# Patient Record
Sex: Male | Born: 1965
Health system: Southern US, Community
[De-identification: ages and names within clinical notes are randomized; demographics above are authoritative.]

## PROBLEM LIST (undated history)

## (undated) DIAGNOSIS — R0789 Other chest pain: Secondary | ICD-10-CM

## (undated) DIAGNOSIS — F32A Depression, unspecified: Secondary | ICD-10-CM

## (undated) DIAGNOSIS — E781 Pure hyperglyceridemia: Secondary | ICD-10-CM

## (undated) DIAGNOSIS — I10 Essential (primary) hypertension: Secondary | ICD-10-CM

## (undated) DIAGNOSIS — Z9889 Other specified postprocedural states: Secondary | ICD-10-CM

## (undated) DIAGNOSIS — F329 Major depressive disorder, single episode, unspecified: Secondary | ICD-10-CM

## (undated) DIAGNOSIS — K219 Gastro-esophageal reflux disease without esophagitis: Secondary | ICD-10-CM

## (undated) DIAGNOSIS — I2510 Atherosclerotic heart disease of native coronary artery without angina pectoris: Secondary | ICD-10-CM

## (undated) HISTORY — PX: CARDIAC CATHETERIZATION: SHX172

## (undated) HISTORY — DX: Pure hyperglyceridemia: E78.1

## (undated) HISTORY — DX: Atherosclerotic heart disease of native coronary artery without angina pectoris: I25.10

## (undated) HISTORY — DX: Gastro-esophageal reflux disease without esophagitis: K21.9

## (undated) HISTORY — DX: Other chest pain: R07.89

## (undated) HISTORY — DX: Essential (primary) hypertension: I10

## (undated) HISTORY — DX: Other specified postprocedural states: Z98.890

---

## 2000-11-01 ENCOUNTER — Emergency Department (HOSPITAL_COMMUNITY): Admission: EM | Admit: 2000-11-01 | Discharge: 2000-11-01 | Payer: Self-pay | Admitting: Emergency Medicine

## 2000-11-01 ENCOUNTER — Encounter: Payer: Self-pay | Admitting: Emergency Medicine

## 2003-02-15 ENCOUNTER — Emergency Department (HOSPITAL_COMMUNITY): Admission: EM | Admit: 2003-02-15 | Discharge: 2003-02-15 | Payer: Self-pay | Admitting: Emergency Medicine

## 2003-02-15 ENCOUNTER — Encounter: Payer: Self-pay | Admitting: Emergency Medicine

## 2003-06-12 ENCOUNTER — Encounter: Admission: RE | Admit: 2003-06-12 | Discharge: 2003-06-12 | Payer: Self-pay | Admitting: Occupational Medicine

## 2003-06-12 ENCOUNTER — Encounter: Payer: Self-pay | Admitting: Occupational Medicine

## 2007-06-13 ENCOUNTER — Emergency Department (HOSPITAL_COMMUNITY): Admission: EM | Admit: 2007-06-13 | Discharge: 2007-06-14 | Payer: Self-pay | Admitting: Emergency Medicine

## 2010-03-02 ENCOUNTER — Ambulatory Visit: Payer: Self-pay | Admitting: Cardiology

## 2010-03-02 ENCOUNTER — Encounter: Payer: Self-pay | Admitting: Cardiology

## 2010-03-02 ENCOUNTER — Inpatient Hospital Stay (HOSPITAL_COMMUNITY): Admission: EM | Admit: 2010-03-02 | Discharge: 2010-03-03 | Payer: Self-pay | Admitting: Cardiology

## 2010-03-03 ENCOUNTER — Encounter: Payer: Self-pay | Admitting: Cardiology

## 2010-03-09 ENCOUNTER — Encounter: Payer: Self-pay | Admitting: Cardiology

## 2010-03-09 DIAGNOSIS — E782 Mixed hyperlipidemia: Secondary | ICD-10-CM | POA: Insufficient documentation

## 2010-03-09 DIAGNOSIS — K219 Gastro-esophageal reflux disease without esophagitis: Secondary | ICD-10-CM | POA: Insufficient documentation

## 2010-03-09 DIAGNOSIS — E785 Hyperlipidemia, unspecified: Secondary | ICD-10-CM

## 2010-03-09 DIAGNOSIS — R079 Chest pain, unspecified: Secondary | ICD-10-CM | POA: Insufficient documentation

## 2010-03-11 ENCOUNTER — Ambulatory Visit: Payer: Self-pay | Admitting: Cardiology

## 2010-04-29 ENCOUNTER — Encounter: Payer: Self-pay | Admitting: Cardiology

## 2010-10-20 NOTE — Miscellaneous (Signed)
  Clinical Lists Changes  Problems: Added new problem of GERD (ICD-530.81) Added new problem of CHEST PAIN (ICD-786.50) Added new problem of DYSLIPIDEMIA (ICD-272.4) Observations: Added new observation of PAST MED HX: Chest tight  troponin elevation 0.4.Marland KitchenMarland KitchenMarland Kitchencath  [facility]....03/03/2010...normal Hypertriglyceridemia GERD EF   60%...cath....02/2010 (03/09/2010 18:02)       Past History:  Past Medical History: Chest tight  troponin elevation 0.4.Marland KitchenMarland KitchenMarland Kitchencath  [facility]....03/03/2010...normal Hypertriglyceridemia GERD EF   60%...cath....02/2010

## 2010-10-20 NOTE — Assessment & Plan Note (Signed)
Summary: eph post cath per Trish needs to have wound check   Visit Type:  hospital follow-up Primary Provider:  Lynden Oxford  CC:  chest pain.  History of Present Illness: The patient was seen at [facility] emergency room for chest discomfort.  He has a history of GERD.  He knows when his GERD symptoms are becoming more marked.  This was not the case on the day of the emergency room visit.  He awoke on that morning with some localized left chest pain.  There was some radiation to his arm.  He felt a little clammy.  He came to emergency room.  The pain was essentially gone when he arrived.  There was no diagnostic EKG changes.  His troponin was 0.4., 4.9, 4.8.  There were no diagnostic EKG changes.  Cardiac catheterization was done.  The coronaries were normal.  There were no significant wall motion abnormalities.  Preventive Screening-Counseling & Management  Alcohol-Tobacco     Smoking Status: never  Current Medications (verified): 1)  Aspirin 325 Mg Tabs (Aspirin) .... Take 1 Tablet By Mouth Once A Day 2)  Metoprolol Tartrate 25 Mg Tabs (Metoprolol Tartrate) .... Take 1/2 Tablet By Mouth Two Times A Day 3)  Crestor 40 Mg Tabs (Rosuvastatin Calcium) .... Take 1 Tablet By Mouth Once A Day 4)  Prilosec Otc 20 Mg Tbec (Omeprazole Magnesium) .... Take 1 Tablet By Mouth Once A Day 5)  Nitrostat 0.4 Mg Subl (Nitroglycerin) .... Use As Directed  Allergies (verified): 1)  ! Sulfa  Comments:  Nurse/Medical Assistant: The patient's medication bottles and allergies were reviewed with the patient and were updated in the Medication and Allergy Lists.  Past History:  Past Medical History: Chest tight  troponin elevation 0.4.Marland KitchenMarland KitchenMarland Kitchencath  [facility]....03/03/2010...normal Hypertriglyceridemia GERD EF   60%...cath....02/2010  Social History: Smoking Status:  never  Review of Systems       Patient denies fever, chills, headache, sweats, rash, change in vision, change in hearing, chest pain,  cough, nausea vomiting, urinary symptoms.  All other systems are reviewed and are negative.  Vital Signs:  Patient profile:   45 year old male Height:      75 inches Weight:      206 pounds BMI:     25.84 Pulse rate:   57 / minute BP sitting:   111 / 73  (left arm) Cuff size:   large  Vitals Entered By: Carlye Grippe (March 11, 2010 3:05 PM)  Nutrition Counseling: Patient's BMI is greater than 25 and therefore counseled on weight management options.  Physical Exam  General:  patient is stable. Head:  head is atraumatic. Eyes:  no xanthelasma. Neck:  no jugular venous distention. Chest Wall:  no chest wall tenderness. Lungs:  lungs are clear.  respiratory effort is nonlabored. Heart:  Cardiac exam reveals S1-S2.  No clicks or significant murmurs Abdomen:  abdomen is soft. Msk:  no musculoskeletal deformities. Extremities:  no peripheral edema. Skin:  no skin rashes. Psych:  patient is oriented to person time and place.  Affect is normal.   Impression & Recommendations:  Problem # 1:  DYSLIPIDEMIA (ICD-272.4)  His updated medication list for this problem includes:    Crestor 40 Mg Tabs (Rosuvastatin calcium) .Marland Kitchen... Take 1 tablet by mouth once a day The patient has dyslipidemia.  We will arrange for his followup fasting lipid profile in 6 weeks.  We may be able to decrease his Crestor dose over time.  Future Orders: T-Hepatic Function [phone]) .Marland KitchenMarland Kitchen  04/20/2010 T-Lipid Profile [phone]) ... 04/20/2010  Problem # 2:  GERD (ICD-530.81)  His updated medication list for this problem includes:    Prilosec Otc 20 Mg Tbec (Omeprazole magnesium) .Marland Kitchen... Take 1 tablet by mouth once a day The patient has GERD.  His most recent symptoms do not seem compatible with his GERD.  Problem # 3:  CHEST PAIN (ICD-786.50)  The following medications were removed from the medication list:    Metoprolol Tartrate 25 Mg Tabs (Metoprolol tartrate) .Marland Kitchen... Take 1/2 tablet by mouth two times a  day His updated medication list for this problem includes:    Aspirin 325 Mg Tabs (Aspirin) .Marland Kitchen... Take 1 tablet by mouth once a day    Nitrostat 0.4 Mg Subl (Nitroglycerin) ..... Use as directed The patient had an episode of chest discomfort with radiation to the arm.  He did have troponin elevation.  Coronary arteries were normal.  Wall motion was normal.  He did not have a myocardial infarction in the true sense of the word related to epicardial disease.  He may have had spasm or any acute microvascular event.  Over time he needs to be treated carefully with aspirin, weight control, blood pressure control, glucose control, lipid control, and exercise.  He is to go back to full activities.  Because he has no proven epicardial disease I have chosen to stop his beta blocker today.  As part of the evaluation today I reviewed the records from [facility] and the discharge summary from [facility] in the catheter report.  I have spoken to the patient at great length along with his wife.  EKG is done today and reviewed by me.  It is normal with mild sinus bradycardia.  I will see him back in 3 months for followup.  Orders: EKG w/ Interpretation ([id number])  Patient Instructions: 1)  Stop Metoprolol. 2)  Your physician recommends that you go to the [facility] for a FASTING lipid profile and liver function labs: DO IN 6 WEEKS. Do not eat or drink after midnight.  3)  Your physician wants you to follow-up in: 3 months. You will receive a reminder letter in the mail one-two months in advance. If you don't receive a letter, please call our office to schedule the follow-up appointment.

## 2010-10-20 NOTE — Letter (Signed)
Summary: [facility] D/C DR. Wende Crease  [facility] D[facility]C DR. Wende Crease   Imported By: Zachary George 03/11/2010 13:59:27  _____________________________________________________________________  External Attachment:    Type:   Image     Comment:   External Document

## 2010-10-20 NOTE — Letter (Signed)
Summary: [facility]   [facility]   Imported By: Roderic Ovens 03/27/2010 15:34:11  _____________________________________________________________________  External Attachment:    Type:   Image     Comment:   External Document

## 2010-12-07 LAB — LIPID PANEL
Cholesterol: 140 mg/dL (ref 0–200)
LDL Cholesterol: 70 mg/dL (ref 0–99)

## 2010-12-07 LAB — CARDIAC PANEL(CRET KIN+CKTOT+MB+TROPI)
CK, MB: 25.1 ng/mL (ref 0.3–4.0)
Relative Index: 6 — ABNORMAL HIGH (ref 0.0–2.5)
Total CK: 425 U/L — ABNORMAL HIGH (ref 7–232)
Troponin I: 4.84 ng/mL (ref 0.00–0.06)
Troponin I: 4.94 ng/mL (ref 0.00–0.06)

## 2010-12-07 LAB — BASIC METABOLIC PANEL
CO2: 25 mEq/L (ref 19–32)
Calcium: 9 mg/dL (ref 8.4–10.5)
Creatinine, Ser: 1.08 mg/dL (ref 0.4–1.5)
GFR calc Af Amer: >60 mL/min (ref >60)
GFR calc non Af Amer: >60 mL/min (ref >60)
Sodium: 139 mEq/L (ref 135–145)

## 2010-12-07 LAB — CBC
MCHC: 34.3 g/dL (ref 30.0–36.0)
MCV: 88.6 fL (ref 78.0–100.0)
Platelets: 209 10*3/uL (ref 150–400)
RBC: 4.88 MIL/uL (ref 4.22–5.81)
RDW: 13.2 % (ref 11.5–15.5)

## 2010-12-07 LAB — PROTIME-INR: INR: 1.03 (ref 0.00–1.49)

## 2010-12-07 LAB — TSH: TSH: 1.391 u[IU]/mL (ref 0.350–4.500)

## 2010-12-07 LAB — MRSA PCR SCREENING: MRSA by PCR: NEGATIVE

## 2010-12-07 LAB — HEPARIN LEVEL (UNFRACTIONATED): Heparin Unfractionated: <0.1 IU/mL — ABNORMAL LOW (ref 0.30–0.70)

## 2011-03-29 ENCOUNTER — Encounter: Payer: Self-pay | Admitting: Cardiology

## 2011-07-01 LAB — POCT I-STAT CREATININE: Operator id: 277751

## 2011-07-01 LAB — POCT CARDIAC MARKERS
CKMB, poc: 2.1
Operator id: 277751
Troponin i, poc: <0.05

## 2011-07-01 LAB — I-STAT 8, (EC8 V) (CONVERTED LAB)
Acid-Base Excess: 2
Glucose, Bld: 96
Potassium: 3.7
TCO2: 30
pCO2, Ven: 47.1
pH, Ven: 7.387 — ABNORMAL HIGH

## 2012-01-13 ENCOUNTER — Emergency Department (HOSPITAL_COMMUNITY)
Admission: EM | Admit: 2012-01-13 | Discharge: 2012-01-13 | Disposition: A | Discharge status: Home / Self Care | Payer: Self-pay | Source: Home / Self Care | Attending: Emergency Medicine | Admitting: Emergency Medicine

## 2012-01-13 ENCOUNTER — Encounter (HOSPITAL_COMMUNITY): Payer: Self-pay | Admitting: Emergency Medicine

## 2012-01-13 DIAGNOSIS — N410 Acute prostatitis: Secondary | ICD-10-CM

## 2012-01-13 LAB — POCT URINALYSIS DIP (DEVICE)
Glucose, UA: NEGATIVE mg/dL
Nitrite: POSITIVE — AB
pH: 5.5 (ref 5.0–8.0)

## 2012-01-13 MED ORDER — LIDOCAINE HCL (PF) 1 % IJ SOLN
INTRAMUSCULAR | Status: AC
  Filled 2012-01-13: qty 5

## 2012-01-13 MED ORDER — CEFTRIAXONE SODIUM 1 G IJ SOLR
1.0000 g | Freq: Once | INTRAMUSCULAR | Status: AC
  Administered 2012-01-13: 1 g via INTRAMUSCULAR

## 2012-01-13 MED ORDER — CEFTRIAXONE SODIUM 1 G IJ SOLR
INTRAMUSCULAR | Status: AC
  Filled 2012-01-13: qty 10

## 2012-01-13 MED ORDER — CIPROFLOXACIN HCL 500 MG PO TABS: 500.0000 mg | ORAL_TABLET | Freq: Two times a day (BID) | ORAL | Status: AC

## 2012-01-13 NOTE — ED Notes (Signed)
Starting yesterday pt has had painful urination and frequency. Pt also complaining of severe lower back pain, headaches, and fever. Pt states ibuprofen has helped but only a little bit.

## 2012-01-13 NOTE — Discharge Instructions (Signed)
Prostatitis Prostatitis is an inflammation (the body's way of reacting to injury and/or infection) of the prostate gland. The prostate gland is a male organ. The gland is about the size and shape of a walnut. The prostate is located just below the bladder. It produces semen, which is a fluid that helps nourish and transport sperm. Prostatitis is the most common urinary tract problem in men younger than age 44. There are 4 categories of prostatitis:  I - Acute bacterial prostatitis.   II - Chronic bacterial prostatitis.   III - Chronic prostatitis and chronic pelvic pain syndrome (CPPS).   Inflammatory.   Non inflammatory.   IV - Asymptomatic inflammatory prostatitis.  Acute and chronic bacterial prostatitis are problems with bacterial infections of the prostate. "Acute" infection is usually a one-time problem. "Chronic" bacterial prostatitis is a condition with recurrent infection. It is usually caused by the same germ(bacteria). CPPS has symptoms similar to prostate infection. However, no infection is actually found. This condition can cause problems of ongoing pain. Currently, it cannot be cured. Treatments are available and aimed at symptom control.  Asymptomatic inflammatory prostatitis has no symptoms. It is a condition where infection-fighting cells are found by chance in the urine. The diagnosis is made most often during an exam for other conditions. Other conditions could be infertility or a high level of PSA (prostate-specific antigen) in the blood. SYMPTOMS  Symptoms can vary depending upon the type of prostatitis that exists. There can also be overlap in symptoms. This can make diagnosis difficult. Symptoms: For Acute bacterial prostatitis  Painful urination.   Fever or chills.   Muscle or joint pains.   Low back pain.   Low abdominal pain.   Inability to empty bladder completely.   Sudden urges to urinate.   Frequent urination during the day.   Difficulty starting  urine stream.   Need to urinate several times at night (nocturia).   Weak urine stream.   Urethral (tube that carries urine from the bladder out of the body) discharge and dribbling after urination.  For Chronic bacterial prostatitis  Rectal pain.   Pain in the testicles, penis, or tip of the penis.   Pain in the space between the anus and scrotum (perineum).   Low back pain.   Low abdominal pain.   Problems with sexual function.   Painful ejaculation.   Bloody semen.   Inability to empty bladder completely.   Painful urination.   Sudden urges to urinate.   Frequent urination during the day.   Difficulty starting urine stream.   Need to urinate several times at night (nocturia).   Weak urine stream.   Dribbling after urination.   Urethral discharge.  For Chronic prostatitis and chronic pelvic pain syndrome (CPPS) Symptoms are the same as those for chronic bacterial prostatitis. Problems with sexual function are often the reason for seeking care. This important problem should be discussed with your caregiver. For Asymptomatic inflammatory prostatitis As noted above, there are no symptoms with this condition. DIAGNOSIS   Your caregiver may perform a rectal exam. This exam is to determine if the prostate is swollen and tender.   Sometimes blood work is performed. This is done to see if your white blood cell count is elevated. The Prostate Specific Antigen (PSA) is also measured. PSA is a blood test that can help detect early prostate cancer.   A urinalysis is done to find out what type of infection is present if this is a suspected cause. An   additional urinalysis may be done after a digital rectal exam. This is to see if white blood cells are pushed out of the prostate and into the urine. A low-grade infection of the prostate may not be found on the first urinalysis.  In more difficult cases, your caregiver may advise other tests. Tests could include:  Urodynamics  -- Tests the function of the bladder and the organs involved in triggering and controlling normal urination.   Urine flow rate.   Cystoscopy -- In this procedure, a thin, telescope-like tube with a light and tiny camera attached (cystoscope) is inserted into the bladder through the urethra. This allows the caregiver to see the inside of the urethra and bladder.   Electromyography -- This procedure tests how the muscles and nerves of the bladder work. It is focused on the muscles that control the anus and pelvic floor. These are the muscles between the anus and scrotum.  In people who show no signs of infection, certain uncommon infections might be causing constant or recurrent symptoms. These uncommon infections are difficult to detect. More work in medicine may help find solutions to these problems. TREATMENT  Antibiotics are used to treat infections caused by germs. If the infection is not treated and becomes long lasting (chronic), it may become a lower grade infection with minor, continual problems. Without treatment, the prostate may develop a boil or furuncle (abscess). This may require surgical treatment. For those with chronic prostatitis and CPPS, it is important to work closely with your primary caregiver and urologist. For some, the medicines that are used to treat a non-cancerous, enlarged prostate (benign prostatic hypertrophy) may be helpful. Referrals to specialists other than urologists may be necessary. In rare cases when all treatments have been inadequate for pain control, an operation to remove the prostate may be recommended. This is very rare and before this is considered thorough discussion with your urologist is highly recommended.  In cases of secondary to chronic non-bacterial prostatitis, a good relationship with your urologist or primary caregiver is essential because it is often a recurrent prolonged condition that requires a good understanding of the causes and a commitment  to therapy aimed at controlling your symptoms. HOME CARE INSTRUCTIONS   Hot sitz baths for 20 minutes, 4 times per day, may help relieve pain.   Non-prescription pain killers may be used as your caregiver recommends if you have no allergies to them. Some illnesses or conditions prevent use of non-prescription drugs. If unsure, check with your caregiver. Take all medications as directed. Take the antibiotics for the prescribed length of time, even if you are feeling better.  SEEK MEDICAL CARE IF:   You have any worsening of the symptoms that originally brought you to your caregiver.   You have an oral temperature above 102 F (38.9 C).   You experience any side effects from medications prescribed.  SEEK IMMEDIATE MEDICAL CARE IF:   You have an oral temperature above 102 F (38.9 C), not controlled by medicine.   You have pain not relieved with medications.   You develop nausea, vomiting, lightheadedness, or have a fainting episode.   You are unable to urinate.   You pass bloody urine or clots.  Document Released: 09/03/2000 Document Revised: 08/26/2011 Document Reviewed: 08/09/2011 [employer] Patient Information 2012 [employer]. 

## 2012-01-13 NOTE — ED Provider Notes (Signed)
Chief Complaint  Patient presents with  . Dysuria    History of Present Illness:   Jon Shaw is a 46 year old male who has had a two-day history of headache, fever, sweats, difficulty urinating, decrease in his urine stream, dysuria, frequency, orange discoloration to his urine, low back pain, and nausea. He denies any vomiting. He had a history of urinary tract infection 10 years ago. No prior history of prostate problems. He denies any urethral discharge or testicular pain or swelling.  Review of Systems:  Other than noted above, the patient denies any of the following symptoms: General:  No fevers, chills, sweats, aches, or fatigue. GI:  No abdominal pain, back pain, nausea, vomiting, diarrhea, or constipation. GU:  No dysuria, frequency, urgency, hematuria, urethral discharge, penile lesions, penile pain, testicular pain, swelling, or mass, inguinal lymphadenopathy or incontinence.  PMFSH:  Past medical history, family history, social history, meds, and allergies were reviewed.  Physical Exam:   Vital signs:  BP 106/66  Pulse 98  Temp(Src) 100.6 F (38.1 C) (Oral)  Resp 14  SpO2 96% Gen:  Alert, oriented, in no distress. Lungs:  Clear to auscultation, no wheezes, rales or rhonchi. Heart:  Regular rhythm, no gallop or murmer. Abdomen:  Flat and soft.  No tenderness to palpation, guarding, or rebound.  No hepato-splenomegaly or mass.  Bowel sounds were normally active.  No hernia. Genital exam:  There is no nasal discharge. No penile lesions. No testicular pain or swelling. Rectal exam:  Digital rectal exam reveals prostate to be enlarged, boggy, and tender to palpation. There were no nodules or masses. Stool was heme-negative. Back:  No CVA tenderness.  Skin:  Clear, warm and dry.  Labs:   Results for orders placed during the hospital encounter of 01/13/12  POCT URINALYSIS DIP (DEVICE)      Component Value Range   Glucose, UA NEGATIVE  NEGATIVE (mg/dL)   Bilirubin Urine SMALL (*)  NEGATIVE    Ketones, ur TRACE (*) NEGATIVE (mg/dL)   Specific Gravity, Urine 1.020  1.005 - 1.030    Hgb urine dipstick LARGE (*) NEGATIVE    pH 5.5  5.0 - 8.0    Protein, ur 100 (*) NEGATIVE (mg/dL)   Urobilinogen, UA 2.0 (*) 0.0 - 1.0 (mg/dL)   Nitrite POSITIVE (*) NEGATIVE    Leukocytes, UA SMALL (*) NEGATIVE     Other Labs Obtained at Urgent Care Center:  Urine was cultured and.  Results are pending at this time and we will call about any positive results.  Course in Urgent Care Center:   He was given Rocephin 1000 mg IM and tolerated this well without any immediate side effects.  Assessment: The encounter diagnosis was Acute prostatitis.   Plan:   1.  The following meds were prescribed:   New Prescriptions   CIPROFLOXACIN (CIPRO) 500 MG TABLET    Take 1 tablet (500 mg total) by mouth every 12 (twelve) hours.   2.  The patient was instructed in symptomatic care and handouts were given. 3.  The patient was told to return if becoming worse in any way, if no better in 3 or 4 days, and given some red flag symptoms that would indicate earlier return. Additionally, he was told to followup with his primary care physician in 2 weeks for a recheck on his prostate and a repeat urinalysis and culture.     Reuben Likes, MD 01/13/12 [phone]

## 2012-01-16 LAB — URINE CULTURE
Colony Count: >=100000
Culture  Setup Time: 201304252043

## 2012-01-17 NOTE — ED Notes (Signed)
Urine culture positive for ESCHERICHIA COLI, adequately treated with Cipro at visit.

## 2016-05-12 ENCOUNTER — Encounter (HOSPITAL_COMMUNITY): Payer: Self-pay | Admitting: *Deleted

## 2016-05-12 ENCOUNTER — Inpatient Hospital Stay (HOSPITAL_COMMUNITY)
Admission: AD | Admit: 2016-05-12 | Discharge: 2016-05-18 | DRG: 885 | Disposition: A | Discharge status: Home / Self Care | Payer: BLUE CROSS/BLUE SHIELD | Attending: Psychiatry | Admitting: Psychiatry

## 2016-05-12 ENCOUNTER — Emergency Department (HOSPITAL_COMMUNITY)
Admission: EM | Admit: 2016-05-12 | Discharge: 2016-05-12 | Disposition: A | Discharge status: Other Acute Inpatient Hospital | Payer: BLUE CROSS/BLUE SHIELD | Attending: Emergency Medicine | Admitting: Emergency Medicine

## 2016-05-12 ENCOUNTER — Encounter (HOSPITAL_COMMUNITY): Payer: Self-pay | Admitting: Emergency Medicine

## 2016-05-12 DIAGNOSIS — E781 Pure hyperglyceridemia: Secondary | ICD-10-CM | POA: Diagnosis present

## 2016-05-12 DIAGNOSIS — E785 Hyperlipidemia, unspecified: Secondary | ICD-10-CM | POA: Diagnosis present

## 2016-05-12 DIAGNOSIS — G4700 Insomnia, unspecified: Secondary | ICD-10-CM | POA: Diagnosis present

## 2016-05-12 DIAGNOSIS — R45851 Suicidal ideations: Secondary | ICD-10-CM | POA: Diagnosis present

## 2016-05-12 DIAGNOSIS — F322 Major depressive disorder, single episode, severe without psychotic features: Secondary | ICD-10-CM | POA: Diagnosis present

## 2016-05-12 DIAGNOSIS — Z818 Family history of other mental and behavioral disorders: Secondary | ICD-10-CM

## 2016-05-12 DIAGNOSIS — F4321 Adjustment disorder with depressed mood: Secondary | ICD-10-CM | POA: Diagnosis present

## 2016-05-12 DIAGNOSIS — Z79899 Other long term (current) drug therapy: Secondary | ICD-10-CM | POA: Diagnosis not present

## 2016-05-12 DIAGNOSIS — Z008 Encounter for other general examination: Secondary | ICD-10-CM | POA: Diagnosis present

## 2016-05-12 DIAGNOSIS — Z7982 Long term (current) use of aspirin: Secondary | ICD-10-CM | POA: Insufficient documentation

## 2016-05-12 DIAGNOSIS — K219 Gastro-esophageal reflux disease without esophagitis: Secondary | ICD-10-CM | POA: Diagnosis present

## 2016-05-12 DIAGNOSIS — F411 Generalized anxiety disorder: Secondary | ICD-10-CM | POA: Diagnosis present

## 2016-05-12 DIAGNOSIS — F332 Major depressive disorder, recurrent severe without psychotic features: Secondary | ICD-10-CM | POA: Diagnosis not present

## 2016-05-12 HISTORY — DX: Major depressive disorder, single episode, unspecified: F32.9

## 2016-05-12 HISTORY — DX: Depression, unspecified: F32.A

## 2016-05-12 LAB — COMPREHENSIVE METABOLIC PANEL
ALBUMIN: 4.8 g/dL (ref 3.5–5.0)
ALK PHOS: 52 U/L (ref 38–126)
ALT: 24 U/L (ref 17–63)
ANION GAP: 9 (ref 5–15)
AST: 20 U/L (ref 15–41)
BILIRUBIN TOTAL: 0.8 mg/dL (ref 0.3–1.2)
BUN: 16 mg/dL (ref 6–20)
CALCIUM: 9.3 mg/dL (ref 8.9–10.3)
CO2: 23 mmol/L (ref 22–32)
CREATININE: 1.03 mg/dL (ref 0.61–1.24)
Chloride: 106 mmol/L (ref 101–111)
Glucose, Bld: 105 mg/dL — ABNORMAL HIGH (ref 65–99)
POTASSIUM: 3.7 mmol/L (ref 3.5–5.1)
Sodium: 138 mmol/L (ref 135–145)
TOTAL PROTEIN: 8.1 g/dL (ref 6.5–8.1)

## 2016-05-12 LAB — CBC
HCT: 45.3 % (ref 39.0–52.0)
Hemoglobin: 15.7 g/dL (ref 13.0–17.0)
MCH: 30.3 pg (ref 26.0–34.0)
MCHC: 34.7 g/dL (ref 30.0–36.0)
MCV: 87.3 fL (ref 78.0–100.0)
PLATELETS: 248 10*3/uL (ref 150–400)
RBC: 5.19 MIL/uL (ref 4.22–5.81)
RDW: 12.6 % (ref 11.5–15.5)
WBC: 8.4 10*3/uL (ref 4.0–10.5)

## 2016-05-12 LAB — ACETAMINOPHEN LEVEL: Acetaminophen (Tylenol), Serum: <10 ug/mL — ABNORMAL LOW (ref 10–30)

## 2016-05-12 LAB — RAPID URINE DRUG SCREEN, HOSP PERFORMED
Amphetamines: NOT DETECTED
Barbiturates: NOT DETECTED
Benzodiazepines: NOT DETECTED
Cocaine: NOT DETECTED
OPIATES: NOT DETECTED
TETRAHYDROCANNABINOL: NOT DETECTED

## 2016-05-12 LAB — SALICYLATE LEVEL: Salicylate Lvl: <4 mg/dL (ref 2.8–30.0)

## 2016-05-12 LAB — ETHANOL

## 2016-05-12 NOTE — ED Notes (Signed)
[facility] called to infor of departure
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
## 2016-05-12 NOTE — ED Notes (Signed)
Security notified to wand patient.  

## 2016-05-12 NOTE — ED Notes (Signed)
Report to ZurichBrittney, Charity fundraiserN, adult unit [facility]

## 2016-05-12 NOTE — ED Triage Notes (Signed)
Pt brought in by [facility] under IVC. Pt IVC by his wife due to making remarks about wanting to harm himself to his daughter. Pt denies any plan or thoughts of SI/HI. Pt hesitant to answer questions. Per deputy, IVC papers are en route by another deputy.

## 2016-05-12 NOTE — ED Provider Notes (Signed)
[facility] Provider Note   CSN: [id number] Arrival date & time: 05/12/16  1238  By signing my name below, I, Jon Shaw, attest that this documentation has been prepared under the direction and in the presence of Jon BerkshireJoseph Nakkia Mackiewicz, MD . Electronically Signed: Freida Busmaniana Shaw, Scribe. 05/12/2016. 1:05 PM.   History   Chief Complaint Chief Complaint  Patient presents with  . V70.1    The patient told a family member that he wanted to kill himself. IVC papers were taken out   The history is provided by the patient. No language interpreter was used.  Altered Mental Status   This is a new problem. The current episode started yesterday. The problem has been resolved. Pertinent negatives include no seizures, no hallucinations, no self-injury and no violence. Risk factors: Unknown. His past medical history is significant for depression.   HPI Comments:  Jon Shaw is a 50 y.o. male with a history of depression who presents to the Emergency Department under IVC. Pt made comments to his daughter yesterday about wanting to harm himself. He denies SI today. He also denies h/o self-injury. Pt has no acute physical complaints or symptoms at this time.    Past Medical History:  Diagnosis Date  . Chest tightness    [facility]. [facility] - normal  . Depression   . GERD (gastroesophageal reflux disease)   . History of cardiac catheterization    EF 60% 6/11  . Hypertriglyceridemia     Patient Active Problem List   Diagnosis Date Noted  . DYSLIPIDEMIA 03/09/2010  . GERD 03/09/2010  . CHEST PAIN 03/09/2010    Past Surgical History:  Procedure Laterality Date  . CARDIAC CATHETERIZATION         Home Medications    Prior to Admission medications   Medication Sig Start Date End Date Taking? Authorizing Provider  aspirin 325 MG tablet Take 325 mg by mouth daily.      Historical Provider, MD  nitroGLYCERIN (NITROSTAT) 0.4 MG SL tablet Place 0.4 mg under the  tongue every 5 (five) minutes as needed.      Historical Provider, MD  omeprazole (PRILOSEC) 20 MG capsule Take 20 mg by mouth daily.      Historical Provider, MD  rosuvastatin (CRESTOR) 10 MG tablet Take 10 mg by mouth daily.      Historical Provider, MD    Family History No family history on file.  Social History Social History  Substance Use Topics  . Smoking status: Never Smoker  . Smokeless tobacco: Never Used  . Alcohol use No     Allergies   Sulfonamide derivatives   Review of Systems Review of Systems  Constitutional: Negative for appetite change and fatigue.  HENT: Negative for congestion, ear discharge and sinus pressure.   Eyes: Negative for discharge.  Respiratory: Negative for cough.   Cardiovascular: Negative for chest pain.  Gastrointestinal: Negative for abdominal pain and diarrhea.  Genitourinary: Negative for frequency and hematuria.  Musculoskeletal: Negative for back pain.  Skin: Negative for rash.  Neurological: Negative for seizures and headaches.  Psychiatric/Behavioral: Positive for suicidal ideas. Negative for hallucinations and self-injury.     Physical Exam Updated Vital Signs BP 148/96 (BP Location: Left Arm)   Pulse 78   Temp 98.9 F (37.2 C) (Oral)   Resp 16   Ht 6\' 3"  (1.905 m)   Wt 195 lb (88.5 kg)   SpO2 98%   BMI 24.37 kg/m   Physical Exam  Constitutional:  He is oriented to person, place, and time. He appears well-developed.  HENT:  Head: Normocephalic.  Eyes: Conjunctivae and EOM are normal. No scleral icterus.  Neck: Neck supple. No thyromegaly present.  Cardiovascular: Normal rate and regular rhythm.  Exam reveals no gallop and no friction rub.   No murmur heard. Pulmonary/Chest: No stridor. He has no wheezes. He has no rales. He exhibits no tenderness.  Abdominal: He exhibits no distension. There is no tenderness. There is no rebound.  Musculoskeletal: Normal range of motion. He exhibits no edema.  Lymphadenopathy:     He has no cervical adenopathy.  Neurological: He is oriented to person, place, and time. He exhibits normal muscle tone. Coordination normal.  Skin: No rash noted. No erythema.  Psychiatric: He exhibits a depressed mood (but not suicidal).  Depressed but not suicidal  Nursing note and vitals reviewed.    ED Treatments / Results  DIAGNOSTIC STUDIES:  Oxygen Saturation is 98% on RA, normal by my interpretation.    COORDINATION OF CARE:  1:00 PM Discussed treatment plan with pt at bedside and pt agreed to plan.  Labs (all labs ordered are listed, but only abnormal results are displayed) Labs Reviewed  COMPREHENSIVE METABOLIC PANEL  ETHANOL  SALICYLATE LEVEL  ACETAMINOPHEN LEVEL  CBC  URINE RAPID DRUG SCREEN, HOSP PERFORMED    EKG  EKG Interpretation None       Radiology No results found.  Procedures Procedures (including critical care time)  Medications Ordered in ED Medications - No data to display   Initial Impression / Assessment and Plan / ED Course  I have reviewed the triage vital signs and the nursing notes.  Pertinent labs & imaging results that were available during my care of the patient were reviewed by me and considered in my medical decision making (see chart for details).  Clinical Course    Patient was seen by behavioral health and felt patient needed inpatient treatment for depression and suicidal ideation  Final Clinical Impressions(s) / ED Diagnoses   Final diagnoses:  None    New Prescriptions New Prescriptions   No medications on file   The chart was scribed for me under my direct supervision.  I personally performed the history, physical, and medical decision making and all procedures in the evaluation of this patient.Jon Shaw.    Damel Querry, MD 05/12/16 [phone]

## 2016-05-12 NOTE — Progress Notes (Signed)
Per C. Withrow, DNP, Pt meets inpt criteria, on 05/12/16.  [facility] to Dr. Jama Flavorsobos, to room 404-1, patient is IVC'd and bed is ready. RN has been informed.  Melbourne Abtsatia Umer Harig, LCSWA Disposition staff 05/12/2016 6:57 PM

## 2016-05-13 DIAGNOSIS — F322 Major depressive disorder, single episode, severe without psychotic features: Secondary | ICD-10-CM | POA: Diagnosis present

## 2016-05-13 DIAGNOSIS — F332 Major depressive disorder, recurrent severe without psychotic features: Secondary | ICD-10-CM

## 2016-05-13 MED ORDER — ACETAMINOPHEN 325 MG PO TABS: 650.0000 mg | ORAL_TABLET | Freq: Four times a day (QID) | ORAL | Status: DC | PRN

## 2016-05-13 MED ORDER — ASPIRIN 325 MG PO TABS
325.0000 mg | ORAL_TABLET | Freq: Every day | ORAL | Status: DC
  Administered 2016-05-13 – 2016-05-18 (×6): 325 mg via ORAL
  Filled 2016-05-13 (×7): qty 1
  Filled 2016-05-13: qty 7
  Filled 2016-05-13: qty 1

## 2016-05-13 MED ORDER — HYDROXYZINE HCL 25 MG PO TABS
25.0000 mg | ORAL_TABLET | Freq: Four times a day (QID) | ORAL | Status: DC | PRN
  Administered 2016-05-13 – 2016-05-18 (×3): 25 mg via ORAL
  Filled 2016-05-13: qty 1
  Filled 2016-05-13: qty 10
  Filled 2016-05-13 (×2): qty 1

## 2016-05-13 MED ORDER — PANTOPRAZOLE SODIUM 40 MG PO TBEC
40.0000 mg | DELAYED_RELEASE_TABLET | Freq: Every day | ORAL | Status: DC
  Administered 2016-05-13 – 2016-05-18 (×6): 40 mg via ORAL
  Filled 2016-05-13 (×5): qty 1
  Filled 2016-05-13: qty 7
  Filled 2016-05-13 (×2): qty 1

## 2016-05-13 MED ORDER — BUPROPION HCL ER (XL) 150 MG PO TB24
150.0000 mg | ORAL_TABLET | Freq: Every day | ORAL | Status: DC
  Administered 2016-05-13 – 2016-05-18 (×6): 150 mg via ORAL
  Filled 2016-05-13 (×2): qty 1
  Filled 2016-05-13: qty 7
  Filled 2016-05-13 (×7): qty 1

## 2016-05-13 MED ORDER — ROSUVASTATIN CALCIUM 10 MG PO TABS
10.0000 mg | ORAL_TABLET | Freq: Every day | ORAL | Status: DC
  Administered 2016-05-13 – 2016-05-17 (×4): 10 mg via ORAL
  Filled 2016-05-13 (×6): qty 1
  Filled 2016-05-13: qty 7
  Filled 2016-05-13: qty 1

## 2016-05-13 MED ORDER — NITROGLYCERIN 0.4 MG SL SUBL: 0.4000 mg | SUBLINGUAL_TABLET | SUBLINGUAL | Status: DC | PRN

## 2016-05-13 MED ORDER — MAGNESIUM HYDROXIDE 400 MG/5ML PO SUSP: 30.0000 mL | Freq: Every day | ORAL | Status: DC | PRN

## 2016-05-13 MED ORDER — TRAZODONE HCL 50 MG PO TABS
50.0000 mg | ORAL_TABLET | Freq: Every evening | ORAL | Status: DC | PRN
Start: 2016-05-13 — End: 2016-05-14
  Administered 2016-05-13: 50 mg via ORAL
  Filled 2016-05-13 (×6): qty 1

## 2016-05-13 MED ORDER — ALUM & MAG HYDROXIDE-SIMETH 200-200-20 MG/5ML PO SUSP: 30.0000 mL | ORAL | Status: DC | PRN

## 2016-05-13 NOTE — Progress Notes (Signed)
Adult Psychoeducational Group Note  Date:  05/13/2016 Time:  11:52 AM  Group Topic/Focus:  Goals Group:   The focus of this group is to help patients establish daily goals to achieve during treatment and discuss how the patient can incorporate goal setting into their daily lives to aide in recovery.   Participation Level:  Active  Participation Quality:  Appropriate  Affect:  Appropriate  Cognitive:  Appropriate  Insight: Appropriate  Engagement in Group:  Improving  Modes of Intervention:  Discussion  Additional Comments:  Pt participated in group and states that he wants to forgive himself for the past and learn to love himself more. Janathan Bribiesca R Kelise Kuch 05/13/2016, 11:52 AM
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
## 2016-05-13 NOTE — Tx Team (Signed)
Interdisciplinary Treatment and Diagnostic Plan Update  05/13/2016 Time of Session: 9:18 AM  Jon Shaw MRN: [id number]  Principal Diagnosis:   MDD (major depressive disorder), severe ([place])  Secondary Diagnoses: Active Problems:   MDD (major depressive disorder), severe ([facility])   Current Medications:  Current Facility-Administered Medications  Medication Dose Route Frequency Provider Last Rate Last Dose  . acetaminophen (TYLENOL) tablet 650 mg  650 mg Oral Q6H PRN Laverle Hobby, PA-C      . alum & mag hydroxide-simeth (MAALOX/MYLANTA) 200-200-20 MG/5ML suspension 30 mL  30 mL Oral Q4H PRN Laverle Hobby, PA-C      . aspirin tablet 325 mg  325 mg Oral Daily Laverle Hobby, PA-C   325 mg at 05/13/16 0825  . buPROPion (WELLBUTRIN XL) 24 hr tablet 150 mg  150 mg Oral Daily Laverle Hobby, PA-C   150 mg at 05/13/16 0825  . hydrOXYzine (ATARAX/VISTARIL) tablet 25 mg  25 mg Oral Q6H PRN Laverle Hobby, PA-C      . magnesium hydroxide (MILK OF MAGNESIA) suspension 30 mL  30 mL Oral Daily PRN Laverle Hobby, PA-C      . nitroGLYCERIN (NITROSTAT) SL tablet 0.4 mg  0.4 mg Sublingual Q5 min PRN Laverle Hobby, PA-C      . pantoprazole (PROTONIX) EC tablet 40 mg  40 mg Oral Daily Laverle Hobby, PA-C   40 mg at 05/13/16 0825  . rosuvastatin (CRESTOR) tablet 10 mg  10 mg Oral Daily Laverle Hobby, PA-C      . traZODone (DESYREL) tablet 50 mg  50 mg Oral QHS,MR X 1 Laverle Hobby, PA-C       PTA Medications: Prescriptions Prior to Admission  Medication Sig Dispense Refill Last Dose  . aspirin 325 MG tablet Take 325 mg by mouth daily.     05/12/2016 at Unknown time  . buPROPion (WELLBUTRIN XL) 150 MG 24 hr tablet    05/12/2016 at Unknown time  . omeprazole (PRILOSEC) 20 MG capsule Take 20 mg by mouth daily.     05/12/2016 at Unknown time  . rosuvastatin (CRESTOR) 10 MG tablet Take 10 mg by mouth daily.     05/12/2016 at Unknown time  . nitroGLYCERIN (NITROSTAT) 0.4 MG SL tablet Place 0.4 mg  under the tongue every 5 (five) minutes as needed.     Unknown at Unknown time    Treatment Modalities: Medication Management, Group therapy, Case management,  1 to 1 session with clinician, Psychoeducation, Recreational therapy.   Physician Treatment Plan for Primary Diagnosis:   MDD (major depressive disorder), severe ([place]) Long Term Goal(s): Improvement in symptoms so as ready for discharge   Short Term Goals: Ability to verbalize feelings will improve, Ability to demonstrate self-control will improve and Ability to identify and develop effective coping behaviors will improve  Medication Management: Evaluate patient's response, side effects, and tolerance of medication regimen.  Therapeutic Interventions: 1 to 1 sessions, Unit Group sessions and Medication administration.  Evaluation of Outcomes: Not Met  Physician Treatment Plan for Secondary Diagnosis: Active Problems:   MDD (major depressive disorder), severe ([place])  Long Term Goal(s): Improvement in symptoms so as ready for discharge  Short Term Goals: Ability to verbalize feelings will improve, Ability to demonstrate self-control will improve and Ability to identify and develop effective coping behaviors will improve  Medication Management: Evaluate patient's response, side effects, and tolerance of medication regimen.  Therapeutic Interventions: 1 to 1 sessions, Unit Group sessions and  Medication administration.  Evaluation of Outcomes: Not Met   RN Treatment Plan for Primary Diagnosis:   MDD (major depressive disorder), severe ([place]) Long Term Goal(s): Knowledge of disease and therapeutic regimen to maintain health will improve  Short Term Goals: Ability to remain free from injury will improve, Ability to demonstrate self-control and Ability to participate in decision making will improve  Medication Management: RN will administer medications as ordered by provider, will assess and evaluate patient's response and provide  education to patient for prescribed medication. RN will report any adverse and/or side effects to prescribing provider.  Therapeutic Interventions: 1 on 1 counseling sessions, Psychoeducation, Medication administration, Evaluate responses to treatment, Monitor vital signs and CBGs as ordered, Perform/monitor CIWA, COWS, AIMS and Fall Risk screenings as ordered, Perform wound care treatments as ordered.  Evaluation of Outcomes: Not Met   LCSW Treatment Plan for Primary Diagnosis:   MDD (major depressive disorder), severe ([place]) Long Term Goal(s): Safe transition to appropriate next level of care at discharge, Engage patient in therapeutic group addressing interpersonal concerns.  Short Term Goals: Engage patient in aftercare planning with referrals and resources, Increase ability to appropriately verbalize feelings and Increase skills for wellness and recovery  Therapeutic Interventions: Assess for all discharge needs, 1 to 1 time with Social worker, Explore available resources and support systems, Assess for adequacy in community support network, Educate family and significant other(s) on suicide prevention, Complete Psychosocial Assessment, Interpersonal group therapy.  Evaluation of Outcomes: Not Met   Progress in Treatment: Attending groups: Pt is new to milieu, continuing to assess  Participating in groups: Pt is new to milieu, continuing to assess  Taking medication as prescribed: Yes, MD continuing to assess for appropriate medication regimen Toleration medication: Yes Family/Significant other contact made: No, CSW assessing for appropriate contact Patient understands diagnosis: Continuing to assess Discussing patient identified problems/goals with staff: Yes Medical problems stabilized or resolved: Yes Denies suicidal/homicidal ideation: No, admitted with passive SI Issues/concerns per patient self-inventory: None reported Other: N/A  New problem(s) identified: None reported at  this time  New Short Term/Long Term Goal(s): None at this time  Discharge Plan or Barriers: CSW will assess for appropriate discharge plan and relevant barriers.   Reason for Continuation of Hospitalization:  Anxiety Depression Medication stabilization Suicidal ideation  Estimated Length of Stay: 3-5 days  Attendees: Patient:   Physician: Dr. Parke Poisson, MD 05/13/2016 9:17 AM  Nursing: Mayra Neer, RN; Larrie Kass., RN 05/13/2016 9:17 AM  RN Care Manager: Lars Pinks 05/13/2016 9:17 AM  Social Worker: Peri Maris, LCSW 05/13/2016 9:17 AM  Social Worker: Tilden Fossa, LCSW 05/13/2016 9:17 AM  Other: Samuel Jester, NP; Lindell Spar, NP 05/13/2016 9:17 AM  Other:  05/13/2016 9:17 AM  Other: 05/13/2016 9:17 AM    Scribe for Treatment Team: Bo Mcclintock, LCSW 05/13/2016 9:17 AM

## 2016-05-13 NOTE — Progress Notes (Signed)
D    Pt is pleasant on approach and cooperative   He is anxious and depressed    He tends to isolate but he behavior has been appropriate towards staff and peers  A   Verbal support given   Medications administered and effectiveness monitored    Q 15 min checks R   Pt is safe at present time and receptive to verbal support
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
## 2016-05-13 NOTE — Progress Notes (Signed)
DAR NOTE: Patient presents with flat affect and depressed mood.  Denies pain, auditory and visual hallucinations.  Rates depression at 0, hopelessness at 0, and anxiety at 0.  Described energy level as normal and concentration as good.  Maintained on routine safety checks.  Medications given as prescribed.  Support and encouragement offered as needed.  States goal for today is "going home."  Patient is withdrawn to self.  Minimal interaction with staff.

## 2016-05-13 NOTE — [facility] Group Notes (Signed)
[facility] Group Therapy 05/13/2016 1:15pm  Type of Therapy: Mental Health Association Presentation  Participation Level: Active  Participation Quality: Attentive  Affect: Appropriate  Cognitive: Oriented  Insight: Developing/Improving  Engagement in Therapy: Engaged  Modes of Intervention: Discussion, Education and Socialization  Summary of Progress/Problems: Mental Health Association (MHA) Speaker came to talk about his personal journey with substance abuse and addiction. The pt processed ways by which to relate to the speaker. MHA speaker provided handouts and educational information pertaining to groups and services offered by the [facility]. Pt was engaged in speaker's presentation and was receptive to resources provided.    Chad CordialLauren Carter, LCSWA 05/13/2016 1:56 PM
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
## 2016-05-13 NOTE — Progress Notes (Signed)
Adult Psychoeducational Group Note  Date:  05/13/2016 Time:  9:56 PM  Group Topic/Focus:  Wrap-Up Group:   The focus of this group is to help patients review their daily goal of treatment and discuss progress on daily workbooks.   Participation Level:  Active  Participation Quality:  Appropriate  Affect:  Appropriate  Cognitive:  Alert  Insight: Appropriate  Engagement in Group:  Engaged  Modes of Intervention:  Discussion  Additional Comments:  Patient states, "this is my 1st full day, and I do not have a goal, but will be working on one while I'm here. Patient also states that the therapy session was great. Adalaide Jaskolski L Ericha Whittingham 05/13/2016, 9:56 PM

## 2016-05-13 NOTE — Tx Team (Addendum)
Initial Treatment Plan 05/13/2016 1:24 AM Suszanne Connersodney Popov UYQ:[id number]    PATIENT STRESSORS: Legal issue Marital or family conflict Occupational concerns   PATIENT STRENGTHS: Ability for insight Average or above average intelligence Capable of independent living Communication skills General fund of knowledge Religious Affiliation Supportive family/friends Work skills   PATIENT IDENTIFIED PROBLEMS: Suicidal ideations, plan to OD  depression  worrying  : "so I can feel better about taking the next step whatever that is"                DISCHARGE CRITERIA:  Improved stabilization in mood, thinking, and/or behavior Need for constant or close observation no longer present Reduction of life-threatening or endangering symptoms to within safe limits  PRELIMINARY DISCHARGE PLAN: Outpatient therapy Participate in family therapy  PATIENT/FAMILY INVOLVEMENT: This treatment plan has been presented to and reviewed with the patient, Suszanne ConnersRodney Bayne, and/or family member.  The patient and family have been given the opportunity to ask questions and make suggestions.  Mickeal NeedyJohnson, Corine Solorio N, RN 05/13/2016, 1:24 AM

## 2016-05-14 MED ORDER — BUSPIRONE HCL 5 MG PO TABS
5.0000 mg | ORAL_TABLET | Freq: Three times a day (TID) | ORAL | Status: DC
  Administered 2016-05-14 – 2016-05-16 (×6): 5 mg via ORAL
  Filled 2016-05-14 (×12): qty 1

## 2016-05-14 MED ORDER — TRAZODONE HCL 50 MG PO TABS
50.0000 mg | ORAL_TABLET | Freq: Every evening | ORAL | Status: DC | PRN
  Administered 2016-05-14: 50 mg via ORAL
  Filled 2016-05-14: qty 1

## 2016-05-14 NOTE — Progress Notes (Signed)
Recreation Therapy Notes  Date: 05/14/16 Time: 0945 Location: [address] Group Room  Group Topic: Stress Management  Goal Area(s) Addresses:  Patient will verbalize importance of using healthy stress management.  Patient will identify positive emotions associated with healthy stress management.   Intervention: Stress Management  Activity :  Progressive Muscle Relaxation.  LRT introduced the technique of progressive muscle relaxation to patients.  Patients were to follow along as LRT read script to engaged in the technique.  Education:  Stress Management, Discharge Planning.   Education Outcome: Acknowledges edcuation/In group clarification offered/Needs additional education  Clinical Observations/Feedback: Pt did not attend group.    Caroll RancherMarjette Rulon Abdalla, LRT/CTRS    Caroll RancherLindsay, Tammee Thielke A 05/14/2016 12:50 PM

## 2016-05-14 NOTE — Progress Notes (Signed)
D    Pt is pleasant on approach and cooperative   He is anxious and depressed    He tends to isolate but he behavior has been appropriate towards staff and peers  He said the new medication he took today made a difference and he feels more hopeful A   Verbal support given   Medications administered and effectiveness monitored    Q 15 min checks R   Pt is safe at present time and receptive to verbal support

## 2016-05-14 NOTE — Plan of Care (Signed)
Problem: Education: Goal: Emotional status will improve Outcome: Progressing Patient denies depressive symptoms.  He denies any thoughts of self harm.

## 2016-05-14 NOTE — Progress Notes (Addendum)
Patient ID: Jon ConnersRodney Silversmith, male   DOB: 12-18-1965, 50 y.o.   MRN: [id number] D: Patient remains isolative and has minimal interaction with staff and peers.  He is attending group and participating.  He minimizes his depressive symptoms, rating depression, hopelessness and anxiety as a 0.  He reports good sleep; good appetite, good concentration and normal energy.  Patient's goal today is to "be a better person." He present with flat affect; sad and depressed mood.  He denies any thoughts of self harm thoughts.  He denies HI/AVH. A: Continue to monitor medication management and MD orders.  Safety checks completed every 15 minutes per protocol.  Offer support and encouragement as needed. R: Patient isolative; minimal visibility in the milieu.

## 2016-05-14 NOTE — Progress Notes (Signed)
Adult Psychoeducational Group Note  Date:  05/14/2016 Time:  11:42 PM  Group Topic/Focus:  Wrap-Up Group:   The focus of this group is to help patients review their daily goal of treatment and discuss progress on daily workbooks.   Participation Level:  Active  Participation Quality:  Appropriate  Affect:  Anxious and Depressed  Cognitive:  Alert, Appropriate and Oriented  Insight: Lacking and Limited  Engagement in Group:  Engaged  Modes of Intervention:  Discussion  Additional Comments:  Patient  Attended group wrap up group, but did not wish to share. Kani Jobson W Thaine Garriga 05/14/2016, 11:42 PM

## 2016-05-14 NOTE — Progress Notes (Signed)
[facility] MD Progress Note  05/14/2016 5:01 PM Heidi Lemay  MRN:  [id number] Subjective:  Patient reports ongoing depression and a sense of apprehension about what family, legal consequences  he might have to face . He states , however, that he is feeling " better" insofar as depression, sadness, and denies any suicidal ideations . He states he has had no thoughts of hurting self, and that " no matter how bad it gets, it will one day get better", also states his adult children have informed him they still love him, support him, which helps him feel better . He identifies his children as a protective factor against suicidality  Denies medication side effects. Objective : I have met with patient and have discussed case with treatment team. Patient reports his mood is improved, denies any suicidal or self injurious ideations at this time. Reports some anxiety, apprehension, particularly regarding not knowing exactly what he may be facing ( insofar as possible legal charges )  Denies medication side effects. Visible on unit, but limited interaction with peers . No disruptive or agitated behaviors on unit .  Principal Problem:  Depression, adjustment disorder with depressed mood Diagnosis:   Patient Active Problem List   Diagnosis Date Noted  . MDD (major depressive disorder), severe ([place]) [F32.2] 05/13/2016  . DYSLIPIDEMIA [E78.5] 03/09/2010  . GERD [K21.9] 03/09/2010  . CHEST PAIN [R07.9] 03/09/2010   Total Time spent with patient: 25 minutes     Past Medical History:  Past Medical History:  Diagnosis Date  . Chest tightness    troponin elevation 0.4. cath [facility] 03/03/10 - normal  . Depression   . GERD (gastroesophageal reflux disease)   . History of cardiac catheterization    EF 60% 6/11  . Hypertriglyceridemia     Past Surgical History:  Procedure Laterality Date  . CARDIAC CATHETERIZATION     Family History: History reviewed. No pertinent family history.  Social History:   History  Alcohol Use No     History  Drug Use No    Social History   Social History  . Marital status: Married    Spouse name: N/A  . Number of children: N/A  . Years of education: N/A   Social History Main Topics  . Smoking status: Never Smoker  . Smokeless tobacco: Never Used  . Alcohol use No  . Drug use: No  . Sexual activity: No   Other Topics Concern  . None   Social History Narrative   Married, 2 children.   [employer] but currently out of work.    Additional Social History:    Pain Medications: NA  Sleep: Good  Appetite:  Fair  Current Medications: Current Facility-Administered Medications  Medication Dose Route Frequency Provider Last Rate Last Dose  . acetaminophen (TYLENOL) tablet 650 mg  650 mg Oral Q6H PRN Laverle Hobby, PA-C      . alum & mag hydroxide-simeth (MAALOX/MYLANTA) 200-200-20 MG/5ML suspension 30 mL  30 mL Oral Q4H PRN Laverle Hobby, PA-C      . aspirin tablet 325 mg  325 mg Oral Daily Laverle Hobby, PA-C   325 mg at 05/14/16 0809  . buPROPion (WELLBUTRIN XL) 24 hr tablet 150 mg  150 mg Oral Daily Laverle Hobby, PA-C   150 mg at 05/14/16 0809  . hydrOXYzine (ATARAX/VISTARIL) tablet 25 mg  25 mg Oral Q6H PRN Laverle Hobby, PA-C   25 mg at 05/13/16 2126  . magnesium hydroxide (MILK OF  MAGNESIA) suspension 30 mL  30 mL Oral Daily PRN Laverle Hobby, PA-C      . nitroGLYCERIN (NITROSTAT) SL tablet 0.4 mg  0.4 mg Sublingual Q5 min PRN Laverle Hobby, PA-C      . pantoprazole (PROTONIX) EC tablet 40 mg  40 mg Oral Daily Laverle Hobby, PA-C   40 mg at 05/14/16 0809  . rosuvastatin (CRESTOR) tablet 10 mg  10 mg Oral Daily Laverle Hobby, PA-C   10 mg at 05/13/16 1726  . traZODone (DESYREL) tablet 50 mg  50 mg Oral QHS,MR X 1 Laverle Hobby, PA-C   50 mg at 05/13/16 2126    Lab Results: No results found for this or any previous visit (from the past 48 hour(s)).  Blood Alcohol level:  Lab Results  Component Value Date    ETH <5 27/25/3664    Metabolic Disorder Labs:  No results found for: PROLACTIN Lab Results  Component Value Date   CHOL  03/03/2010    140        ATP III CLASSIFICATION:  <200     mg/dL   Desirable  200-239  mg/dL   Borderline High  >=240    mg/dL   High          TRIG 194 (H) 03/03/2010   HDL 31 (L) 03/03/2010   CHOLHDL 4.5 03/03/2010   VLDL 39 03/03/2010   LDLCALC  03/03/2010    70        Total Cholesterol/HDL:CHD Risk Coronary Heart Disease Risk Table                     Men   Women  1/2 Average Risk   3.4   3.3  Average Risk       5.0   4.4  2 X Average Risk   9.6   7.1  3 X Average Risk  23.4   11.0        Use the calculated Patient Ratio above and the CHD Risk Table to determine the patient's CHD Risk.        ATP III CLASSIFICATION (LDL):  <100     mg/dL   Optimal  100-129  mg/dL   Near or Above                    Optimal  130-159  mg/dL   Borderline  160-189  mg/dL   High  >190     mg/dL   Very High    Physical Findings: AIMS: Facial and Oral Movements Muscles of Facial Expression: None, normal Lips and Perioral Area: None, normal Jaw: None, normal Tongue: None, normal,Extremity Movements Upper (arms, wrists, hands, fingers): None, normal Lower (legs, knees, ankles, toes): None, normal, Trunk Movements Neck, shoulders, hips: None, normal, Overall Severity Severity of abnormal movements (highest score from questions above): None, normal Incapacitation due to abnormal movements: None, normal Patient's awareness of abnormal movements (rate only patient's report): No Awareness, Dental Status Current problems with teeth and/or dentures?: No Does patient usually wear dentures?: No  CIWA:    COWS:     Musculoskeletal: Strength & Muscle Tone: within normal limits Gait & Station: normal Patient leans: N/A  Psychiatric Specialty Exam: Physical Exam  ROS denies headache, denies chest pain, no shortness of breath, no nausea, no vomiting   Blood pressure  123/89, pulse 97, temperature 97.8 F (36.6 C), temperature source Oral, resp. rate 18, height _0  (1.905 m), weight  189 lb (85.7 kg).Body mass index is 23.62 kg/m.  General Appearance: Fairly Groomed  Eye Contact:  Good  Speech:  Normal Rate  Volume:  Normal  Mood:  slightly depresed, affect anxious, but reactive   Affect:  Appropriate and anxious   Thought Process:  Linear  Orientation:  Full (Time, Place, and Person)  Thought Content:  no hallucinations, no delusions, not internally preoccupied   Suicidal Thoughts:  No denies any suicidal ideations, and denies any self injurious ideations, contracts for safety on the unit  Homicidal Thoughts:  No also specifically denies any violent or homicidal ideations towards any family member or the minor who accused him   Memory:  recent and remote grossly intact   Judgement:  Fair  Insight:  Fair  Psychomotor Activity:  Normal  Concentration:  Concentration: Good and Attention Span: Good  Recall:  Good  Fund of Knowledge:  Good  Language:  Good  Akathisia:  Negative  Handed:  Right  AIMS (if indicated):     Assets:  Desire for Improvement Resilience  ADL's:  Intact  Cognition:  WNL  Sleep:  Number of Hours: 6.75   Assessment - patient improving compared to prior to admission - states mood improved and currently does not endorse feeling severely depressed or any suicidal ideations . He is anxious, apprehensive, but responds to empathic listening  and affect tends to improve as session progresses. Tolerating medications well at this time.     Treatment Plan Summary: Daily contact with patient to assess and evaluate symptoms and progress in treatment, Medication management, Plan inpatient admission ' and medications as below  Continue to encourage group , milieu participation to work on coping skills and symptom reduction  Continue Wellbutrin XL 150 mgrs QDAY for depression - patient aware this medication may be better for depression  than for anxiety, but states it has helped and does not want to change to another antidepressant regimen at this time  Start Buspar 5 mgrs TID for anxiety and augmentation  Continue Vistaril  25 mgrs Q 6 H PRNs for anxiety , as needed  Continue Trazodone 50 mgrs QHS PRN for insomnia as needed  Treatment team working on discharge planning  Neita Garnet, MD 05/14/2016, 5:01 PM

## 2016-05-14 NOTE — [facility] Group Notes (Signed)
[facility] LCSW Group Therapy 05/14/2016 1:15pm  Type of Therapy: Group Therapy- Feelings Around Relapse and Recovery  Participation Level: Active   Participation Quality:  Appropriate  Affect:  Appropriate  Cognitive: Alert and Oriented   Insight:  Developing   Engagement in Therapy: Developing/Improving and Engaged   Modes of Intervention: Clarification, Confrontation, Discussion, Education, Exploration, Limit-setting, Orientation, Problem-solving, Rapport Building, Dance movement psychotherapisteality Testing, Socialization and Support  Summary of Progress/Problems: The topic for today was feelings about relapse. The group discussed what relapse prevention is to them and identified triggers that they are on the path to relapse. Members also processed their feeling towards relapse and were able to relate to common experiences. Group also discussed coping skills that can be used for relapse prevention.     Therapeutic Modalities:   Cognitive Behavioral Therapy Solution-Focused Therapy Assertiveness Training Relapse Prevention Therapy    Lamar SprinklesLauren Carter, LCSWA [phone] 05/14/2016 6:13 PM

## 2016-05-15 NOTE — Progress Notes (Signed)
Adult Psychoeducational Group Note  Date:  05/15/2016 Time:  9:28 PM  Group Topic/Focus:  Wrap-Up Group:   The focus of this group is to help patients review their daily goal of treatment and discuss progress on daily workbooks.   Participation Level:  Active  Participation Quality:  Appropriate  Affect:  Appropriate  Cognitive:  Alert and Appropriate  Insight: Appropriate  Engagement in Group:  Engaged and Supportive  Modes of Intervention:  Discussion  Additional Comments:  Patient reported being able to work on his goal of increasing socialization with others.  Patient confirmed playing cards and outside with his peers.  Patient identified deep breathing as a coping skill for him.   Elmore GuiseSLOAN, Sherly Brodbeck N 05/15/2016, 9:28 PM

## 2016-05-15 NOTE — Progress Notes (Signed)
D:Patient up and visible in the milieu, observed reading Bible in dayroom frequently. He remains quiet, withdrawn. Rates sleep good, appetite good, energy normal and concentration good. Patient's affect flat, mood depressed. Rating his depression at a 1/10, hopelessness at a 1/10 and anxiety at a 1/10. States goal for today is to "be a better and healthier person and listen to helpful advice." Denies pain, physical problems.   A: Medicated per orders, no prns requested/needed. Emotional support offered and self inventory reviewed.    R: Patient denies SI/HI and remains safe on level III obs.

## 2016-05-15 NOTE — [facility] Group Notes (Signed)
[facility] Group Notes:  (Nursing/MHT/Case Management/Adjunct)  Date:  05/15/2016  Time:  0930  Type of Therapy:  Nurse Education - Coping Skills and Strategies  Participation Level:  Active  Participation Quality:  Attentive  Affect:  Blunted and Depressed  Cognitive:  Alert  Insight:  Improving  Engagement in Group:  Engaged  Modes of Intervention:  Discussion, Education and Support  Summary of Progress/Problems: Patient shared he plans to "work on myself - take what I have learned and apply it. Also, read my Bible and talk to others."  Lawrence MarseillesFriedman, Taura Lamarre Eakes 05/15/2016, 1:38 PM

## 2016-05-15 NOTE — Plan of Care (Signed)
Problem: Safety: Goal: Periods of time without injury will increase Outcome: Progressing Patient has not engaged in self harm, denies SI.  Problem: Medication: Goal: Compliance with prescribed medication regimen will improve Outcome: Progressing Patient has been med compliant.   

## 2016-05-15 NOTE — Progress Notes (Signed)
Patient ID: Jon Shaw, male   DOB: Nov 30, 1965, 50 y.o.   MRN: [id number] [facility] MD Progress Note  05/15/2016 5:43 PM Roldan Laforest  MRN:  [id number] Subjective:  Patient reports that he is feeling better , less depressed, less severely anxious .  Denies medication side effects. At this time he is denying any suicidal ideations and is more future oriented - states he is considering obtaining legal counsel , and that he is considering relocating from his town to [place], because he realizes he might be ostracized and avoided in his community . States that his wife , members of his church, have come to visit, and that this has also helped him feel better.  Objective : I have met with patient and have  Reviewed chart notes . Patient remains anxious , vaguely depressed, but states that he is feeling better . He denies any suicidal ideations, and is currently future oriented and presenting with a more organized and future focused plan. As above, states he is considering obtaining legal counsel, considering relocating to a new town where he is not well known, in order to avoid public shaming and being ostracized . Patient reports  he thinks he may have a sexual addiction - I asked him to describe what he meant .  Of note, he categorically denies any pedophilia, and does not endorse any other paraphilia- he reports he watches pornography, but " its not all the time , it's sometimes ", reports he does feel there is sometimes a  Compulsive element to this activity, causing him some sense of concern, but no severe feelings of distress . Denies medication side effects. Visible on unit, more interactive in milieu . No disruptive or agitated behaviors on unit .  Principal Problem:  Depression, adjustment disorder with depressed mood Diagnosis:   Patient Active Problem List   Diagnosis Date Noted  . MDD (major depressive disorder), severe ([place]) [F32.2] 05/13/2016  . DYSLIPIDEMIA [E78.5] 03/09/2010  . GERD  [K21.9] 03/09/2010  . CHEST PAIN [R07.9] 03/09/2010   Total Time spent with patient: 25 minutes     Past Medical History:  Past Medical History:  Diagnosis Date  . Chest tightness    troponin elevation 0.4. cath [facility] 03/03/10 - normal  . Depression   . GERD (gastroesophageal reflux disease)   . History of cardiac catheterization    EF 60% 6/11  . Hypertriglyceridemia     Past Surgical History:  Procedure Laterality Date  . CARDIAC CATHETERIZATION     Family History: History reviewed. No pertinent family history.  Social History:  History  Alcohol Use No     History  Drug Use No    Social History   Social History  . Marital status: Married    Spouse name: N/A  . Number of children: N/A  . Years of education: N/A   Social History Main Topics  . Smoking status: Never Smoker  . Smokeless tobacco: Never Used  . Alcohol use No  . Drug use: No  . Sexual activity: No   Other Topics Concern  . None   Social History Narrative   Married, 2 children.   [employer] but currently out of work.    Additional Social History:    Pain Medications: NA  Sleep: Good  Appetite: good   Current Medications: Current Facility-Administered Medications  Medication Dose Route Frequency Provider Last Rate Last Dose  . acetaminophen (TYLENOL) tablet 650 mg  650 mg Oral Q6H PRN Laverle Hobby, PA-C      .  alum & mag hydroxide-simeth (MAALOX/MYLANTA) 200-200-20 MG/5ML suspension 30 mL  30 mL Oral Q4H PRN Laverle Hobby, PA-C      . aspirin tablet 325 mg  325 mg Oral Daily Laverle Hobby, PA-C   325 mg at 05/15/16 0846  . buPROPion (WELLBUTRIN XL) 24 hr tablet 150 mg  150 mg Oral Daily Laverle Hobby, PA-C   150 mg at 05/15/16 0845  . busPIRone (BUSPAR) tablet 5 mg  5 mg Oral TID Jenne Campus, MD   5 mg at 05/15/16 1700  . hydrOXYzine (ATARAX/VISTARIL) tablet 25 mg  25 mg Oral Q6H PRN Laverle Hobby, PA-C   25 mg at 05/14/16 2120  . magnesium hydroxide (MILK OF  MAGNESIA) suspension 30 mL  30 mL Oral Daily PRN Laverle Hobby, PA-C      . nitroGLYCERIN (NITROSTAT) SL tablet 0.4 mg  0.4 mg Sublingual Q5 min PRN Laverle Hobby, PA-C      . pantoprazole (PROTONIX) EC tablet 40 mg  40 mg Oral Daily Laverle Hobby, PA-C   40 mg at 05/15/16 0845  . rosuvastatin (CRESTOR) tablet 10 mg  10 mg Oral Daily Laverle Hobby, PA-C   10 mg at 05/15/16 1701  . traZODone (DESYREL) tablet 50 mg  50 mg Oral QHS PRN Jenne Campus, MD   50 mg at 05/14/16 2120    Lab Results: No results found for this or any previous visit (from the past 48 hour(s)).  Blood Alcohol level:  Lab Results  Component Value Date   ETH <5 56/25/6389    Metabolic Disorder Labs:  No results found for: PROLACTIN Lab Results  Component Value Date   CHOL  03/03/2010    140        ATP III CLASSIFICATION:  <200     mg/dL   Desirable  200-239  mg/dL   Borderline High  >=240    mg/dL   High          TRIG 194 (H) 03/03/2010   HDL 31 (L) 03/03/2010   CHOLHDL 4.5 03/03/2010   VLDL 39 03/03/2010   LDLCALC  03/03/2010    70        Total Cholesterol/HDL:CHD Risk Coronary Heart Disease Risk Table                     Men   Women  1/2 Average Risk   3.4   3.3  Average Risk       5.0   4.4  2 X Average Risk   9.6   7.1  3 X Average Risk  23.4   11.0        Use the calculated Patient Ratio above and the CHD Risk Table to determine the patient's CHD Risk.        ATP III CLASSIFICATION (LDL):  <100     mg/dL   Optimal  100-129  mg/dL   Near or Above                    Optimal  130-159  mg/dL   Borderline  160-189  mg/dL   High  >190     mg/dL   Very High    Physical Findings: AIMS: Facial and Oral Movements Muscles of Facial Expression: None, normal Lips and Perioral Area: None, normal Jaw: None, normal Tongue: None, normal,Extremity Movements Upper (arms, wrists, hands, fingers): None, normal Lower (legs, knees, ankles, toes): None, normal,  Trunk Movements Neck, shoulders,  hips: None, normal, Overall Severity Severity of abnormal movements (highest score from questions above): None, normal Incapacitation due to abnormal movements: None, normal Patient's awareness of abnormal movements (rate only patient's report): No Awareness, Dental Status Current problems with teeth and/or dentures?: No Does patient usually wear dentures?: No  CIWA:    COWS:     Musculoskeletal: Strength & Muscle Tone: within normal limits Gait & Station: normal Patient leans: N/A  Psychiatric Specialty Exam: Physical Exam  ROS denies headache, denies chest pain, no shortness of breath, no nausea, no vomiting   Blood pressure 121/83, pulse 88, temperature 98 F (36.7 C), temperature source Oral, resp. rate 18, height 6' 3" (1.905 m), weight 189 lb (85.7 kg).Body mass index is 23.62 kg/m.  General Appearance: Well Groomed  Eye Contact:  Good  Speech:  Normal Rate  Volume:  Normal  Mood:  Reports mood is improving , feels less depressed   Affect:  More reactive affect, remains anxious   Thought Process:  Linear  Orientation:  Full (Time, Place, and Person)  Thought Content:  no hallucinations, no delusions, not internally preoccupied   Suicidal Thoughts:  No denies any suicidal ideations, and denies any self injurious ideations, contracts for safety on the unit  Homicidal Thoughts:  No also specifically denies any violent or homicidal ideations towards any family member or the minor who accused him   Memory:  recent and remote grossly intact   Judgement:  Improving   Insight:  Fair  Psychomotor Activity:  Normal  Concentration:  Concentration: Good and Attention Span: Good  Recall:  Good  Fund of Knowledge:  Good  Language:  Good  Akathisia:  Negative  Handed:  Right  AIMS (if indicated):     Assets:  Desire for Improvement Resilience  ADL's:  Intact  Cognition:  WNL  Sleep:  Number of Hours: 6.5   Assessment - patient continues to improve compared to admission- at this  time presents with improving mood, is more future oriented, and more focused on plans of action to address his stressors. Denies any suicidal ideations. No disruptive behaviors on unit . Tolerating medications well .     Treatment Plan Summary: Daily contact with patient to assess and evaluate symptoms and progress in treatment, Medication management, Plan inpatient admission ' and medications as below  Continue to encourage group , milieu participation to work on coping skills and symptom reduction  Continue Wellbutrin XL 150 mgrs QDAY for depression  Continue  Buspar 5 mgrs TID for anxiety and augmentation  Continue Vistaril  25 mgrs Q 6 H PRNs for anxiety , as needed  Continue Trazodone 50 mgrs QHS PRN for insomnia as needed  Treatment team working on discharge planning  Neita Garnet, MD 05/15/2016, 5:43 PM

## 2016-05-15 NOTE — [facility] Group Notes (Signed)
[facility] LCSW Group Therapy  05/15/2016 10:00 until 11:00 AM  Type of Therapy:  Group Therapy  Participation Level:  Did Not Attend despite overhead announcement and fact to face encouragement from LCSW.    Carney Bernatherine C Delayna Sparlin, LCSW

## 2016-05-15 NOTE — Plan of Care (Signed)
Problem: Activity: Goal: Interest or engagement in activities will improve Outcome: Progressing Pt is more engaged in activities on the unit Goal: Sleeping patterns will improve Outcome: Progressing Pt reports sleeping much better since hospitalization

## 2016-05-15 NOTE — Progress Notes (Signed)
D    Pt is pleasant on approach and cooperative   He is anxious and depressed    He tends to isolate but he behavior has been appropriate towards staff and peers  He was less social this evening and spent more time in his room   He did not request extra medication for sleep A   Verbal support given   Medications administered and effectiveness monitored    Q 15 min checks R   Pt is safe at present time and receptive to verbal support

## 2016-05-16 MED ORDER — BUSPIRONE HCL 10 MG PO TABS
10.0000 mg | ORAL_TABLET | Freq: Three times a day (TID) | ORAL | Status: DC
  Administered 2016-05-16 – 2016-05-18 (×5): 10 mg via ORAL
  Filled 2016-05-16 (×3): qty 1
  Filled 2016-05-16: qty 2
  Filled 2016-05-16: qty 1
  Filled 2016-05-16: qty 21
  Filled 2016-05-16: qty 1
  Filled 2016-05-16: qty 21
  Filled 2016-05-16 (×3): qty 1
  Filled 2016-05-16: qty 21

## 2016-05-16 NOTE — [facility] Group Notes (Signed)
[facility] Group Notes:  (Nursing/MHT/Case Management/Adjunct)  Date:  05/16/2016  Time:  10:45 AM  Type of Therapy:  Nurse Education  Participation Level:  Did Not Attend  Summary of Progress/Problems: Pt did not attend but was invited.   Maurine SimmeringShugart, Gwen Sarvis M 05/16/2016, 10:45 AM

## 2016-05-16 NOTE — Progress Notes (Signed)
Patient ID: Jon Shaw, male   DOB: 1966/07/20, 50 y.o.   MRN: [id number] [facility] MD Progress Note  05/16/2016 3:20 PM Jon Shaw  MRN:  [id number] Subjective:  Reports feeling better, less significantly anxious . Denies medication side effects. States he has had supportive visits from friends, members of his church , which have helped him feel better, and that wife, although angry, has been supportive as well . 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   Objective : I have met with patient and have  Reviewed chart notes . Patient presents calmer than on admission, although still vaguely anxious- no psychomotor agitation or distress . Reports, as above, that visits from wife and friends have been helpful, and states that he is no longer " even considering " suicide. States " whatever happens is God's will, and I will be all right ". As noted, he is future oriented . Patient has described concerns about feeling he may have a "sexual addiction "- describes this as episodes of watching pornographic material in spite of guilty feelings about it and concerns about its possible negative effect on his relationship with wife . States that he would like to get treatment after discharge to address these issues . At this time he is future oriented, states his wife is helping him determine if there is any current legal consequences of recent allegations and how to proceed.  On unit he has been calm, pleasant, somewhat anxious, visible in day room, no disruptive, no agitated and no inappropriate behaviors noted or reported .  Denies medication side effects.   Principal Problem:  Depression, adjustment disorder with depressed mood  Diagnosis:   Patient Active Problem List   Diagnosis Date Noted  . MDD (major depressive disorder),  severe ([place]) [F32.2] 05/13/2016  . DYSLIPIDEMIA [E78.5] 03/09/2010  . GERD [K21.9] 03/09/2010  . CHEST PAIN [R07.9] 03/09/2010   Total Time spent with patient: 20 minutes     Past Medical History:  Past Medical History:  Diagnosis Date  . Chest tightness    troponin elevation 0.4. cath [facility] 03/03/10 - normal  . Depression   . GERD (gastroesophageal reflux disease)   . History of cardiac catheterization    EF 60% 6/11  . Hypertriglyceridemia     Past Surgical History:  Procedure Laterality Date  . CARDIAC CATHETERIZATION     Family History: History reviewed. No pertinent family history.  Social History:  History  Alcohol Use No     History  Drug Use No    Social History   Social History  . Marital status: Married    Spouse name: N/A  . Number of children: N/A  . Years of education: N/A   Social History Main Topics  . Smoking status: Never Smoker  . Smokeless tobacco: Never Used  . Alcohol use No  . Drug use: No  . Sexual activity: No   Other Topics Concern  . None   Social History Narrative   Married, 2 children.   [employer] but currently out of work.    Additional Social History:    Pain Medications: NA  Sleep: Good  Appetite: good   Current Medications: Current Facility-Administered Medications  Medication Dose Route Frequency Provider Last Rate Last Dose  . acetaminophen (TYLENOL) tablet 650 mg  650 mg Oral Q6H  PRN Laverle Hobby, PA-C      . alum & mag hydroxide-simeth (MAALOX/MYLANTA) 200-200-20 MG/5ML suspension 30 mL  30 mL Oral Q4H PRN Laverle Hobby, PA-C      . aspirin tablet 325 mg  325 mg Oral Daily Laverle Hobby, PA-C   325 mg at 05/16/16 0759  . buPROPion (WELLBUTRIN XL) 24 hr tablet 150 mg  150 mg Oral Daily Laverle Hobby, PA-C   150 mg at 05/16/16 0759  . busPIRone (BUSPAR) tablet 5 mg  5 mg Oral TID Jenne Campus, MD   5 mg at 05/16/16 1213  . hydrOXYzine (ATARAX/VISTARIL) tablet 25 mg  25 mg Oral Q6H PRN Laverle Hobby, PA-C   25 mg at 05/14/16 2120  . magnesium hydroxide (MILK OF MAGNESIA) suspension 30 mL  30 mL Oral Daily PRN Laverle Hobby, PA-C      . nitroGLYCERIN (NITROSTAT) SL tablet 0.4 mg  0.4 mg Sublingual Q5 min PRN Laverle Hobby, PA-C      . pantoprazole (PROTONIX) EC tablet 40 mg  40 mg Oral Daily Laverle Hobby, PA-C   40 mg at 05/16/16 0759  . rosuvastatin (CRESTOR) tablet 10 mg  10 mg Oral Daily Laverle Hobby, PA-C   10 mg at 05/15/16 1701  . traZODone (DESYREL) tablet 50 mg  50 mg Oral QHS PRN Jenne Campus, MD   50 mg at 05/14/16 2120    Lab Results: No results found for this or any previous visit (from the past 48 hour(s)).  Blood Alcohol level:  Lab Results  Component Value Date   ETH <5 25/01/3975    Metabolic Disorder Labs:  No results found for: PROLACTIN Lab Results  Component Value Date   CHOL  03/03/2010    140        ATP III CLASSIFICATION:  <200     mg/dL   Desirable  200-239  mg/dL   Borderline High  >=240    mg/dL   High          TRIG 194 (H) 03/03/2010   HDL 31 (L) 03/03/2010   CHOLHDL 4.5 03/03/2010   VLDL 39 03/03/2010   LDLCALC  03/03/2010    70        Total Cholesterol/HDL:CHD Risk Coronary Heart Disease Risk Table                     Men   Women  1/2 Average Risk   3.4   3.3  Average Risk       5.0   4.4  2 X Average Risk   9.6   7.1  3 X Average Risk  23.4   11.0        Use the calculated Patient Ratio above and the CHD Risk Table to determine the patient's CHD Risk.        ATP III CLASSIFICATION (LDL):  <100     mg/dL   Optimal  100-129  mg/dL   Near or Above                    Optimal  130-159  mg/dL   Borderline  160-189  mg/dL   High  >190     mg/dL   Very High    Physical Findings: AIMS: Facial and Oral Movements Muscles of Facial Expression: None, normal Lips and Perioral Area: None, normal Jaw: None, normal Tongue: None, normal,Extremity Movements Upper (arms, wrists,  hands, fingers): None, normal Lower (legs,  knees, ankles, toes): None, normal, Trunk Movements Neck, shoulders, hips: None, normal, Overall Severity Severity of abnormal movements (highest score from questions above): None, normal Incapacitation due to abnormal movements: None, normal Patient's awareness of abnormal movements (rate only patient's report): No Awareness, Dental Status Current problems with teeth and/or dentures?: No Does patient usually wear dentures?: No  CIWA:    COWS:     Musculoskeletal: Strength & Muscle Tone: within normal limits Gait & Station: normal Patient leans: N/A  Psychiatric Specialty Exam: Physical Exam  ROS denies headache, denies chest pain, no shortness of breath, no nausea, no vomiting   Blood pressure (!) 130/91, pulse 86, temperature 98.3 F (36.8 C), temperature source Oral, resp. rate 16, height 6' 3"  (1.905 m), weight 189 lb (85.7 kg).Body mass index is 23.62 kg/m.  General Appearance: Well Groomed  Eye Contact:  Good  Speech:  Normal Rate  Volume:  Normal  Mood:  Improved mood   Affect:  Smiles at times appropriately, more reactive, still tends to be anxious, but some improvement compared to admission presentation  Thought Process:  Linear  Orientation:  Full (Time, Place, and Person)  Thought Content:  no hallucinations, no delusions, not internally preoccupied   Suicidal Thoughts:  No denies any suicidal ideations, and denies any self injurious ideations, contracts for safety on the unit  Homicidal Thoughts:  No also specifically denies any violent or homicidal ideations towards any family member or the minor who accused him   Memory:  recent and remote grossly intact   Judgement:  Improving   Insight:  Fair  Psychomotor Activity:  Normal  Concentration:  Concentration: Good and Attention Span: Good  Recall:  Good  Fund of Knowledge:  Good  Language:  Good  Akathisia:  Negative  Handed:  Right  AIMS (if indicated):     Assets:  Desire for Improvement Resilience  ADL's:   Intact  Cognition:  WNL  Sleep:  Number of Hours: 6   Assessment - patient reports improved mood, denies severe neuro-vegetative symptoms of depression, and presents with a more reactive, but still anxious, affect . Future oriented, focusing on disposition planning and on possible legal issues he may be facing . Tolerating medications well . Reports he feels Buspar has been " really helpful".      Treatment Plan Summary: Daily contact with patient to assess and evaluate symptoms and progress in treatment, Medication management, Plan inpatient admission ' and medications as below  Continue to encourage group , milieu participation to work on coping skills and symptom reduction  Continue Wellbutrin XL 150 mgrs QDAY for depression  Increase   Buspar to 10  mgrs TID for anxiety and augmentation  Continue Vistaril  25 mgrs Q 6 H PRNs for anxiety , as needed  Continue Trazodone 50 mgrs QHS PRN for insomnia as needed  Treatment team working on discharge planning  Neita Garnet, MD 05/16/2016, 3:20 PM

## 2016-05-16 NOTE — Plan of Care (Signed)
Problem: Activity: Goal: Interest or engagement in activities will improve Outcome: Not Progressing Pt isolates to his room and has limited engagement Goal: Sleeping patterns will improve Outcome: Progressing Pt sleeps without a sleep aide

## 2016-05-16 NOTE — Progress Notes (Signed)
D Jon Shaw is seen sitting in the dayroom...watching TV and talking with a few of the patients gathered in there with him. He is pleasant. He is pleasant. He hesitates when he approaches this Clinical research associatewriter. He requests to be called " Jon Shaw" by this Clinical research associatewriter. A He completed his daily assessment and on it he wrote  He deneid SI today and he rated his depression, hopelessness and anxiety " 0/1/4", respectively. R Safety is in place and he is hoping to be dc'd soon.

## 2016-05-16 NOTE — Progress Notes (Signed)
Adult Psychoeducational Group Note  Date:  05/16/2016 Time:  11:40 PM  Group Topic/Focus:  Wrap-Up Group:   The focus of this group is to help patients review their daily goal of treatment and discuss progress on daily workbooks.   Participation Level:  Active  Participation Quality:  Appropriate  Affect:  Appropriate  Cognitive:  Alert, Appropriate and Oriented  Insight: Appropriate  Engagement in Group:  Engaged  Modes of Intervention:  Discussion  Additional Comments:  Patient attended wrap-up group and said his day was a 2.  He was anxious because he will be discharged tomorrow.  His goal for today was preparing for discharge and his coping skills were reading his bible and singing.  Yarelin Reichardt W Paulena Servais 05/16/2016, 11:40 PM

## 2016-05-17 DIAGNOSIS — Z79899 Other long term (current) drug therapy: Secondary | ICD-10-CM

## 2016-05-17 NOTE — Progress Notes (Signed)
D: Pt. goal for today is to "socialize and play basketball" and states that he "accomplished just that". Pt. is up and visible in the milieu interacting with peers and playing cards. Denies having any SI/HI and AVH at this time. Rates pain as 0/10. Pt. states his anxiety has improved throughout the day. Pt. is pleasant, cooperative and calm.   A: Encouragement and support given. No meds. ordered at this time.   R: Safety maintained with Q 15 checks. Continues to follow treatment plan and will monitor closely.   Signed by: Tyrone AppleEmily Xaden Kaufman, RN

## 2016-05-17 NOTE — Progress Notes (Signed)
Family session scheduled for 11:00am with wife, Lupita LeashDonna.  Chad CordialLauren Carter, LCSW Clinical Social Work [phone]

## 2016-05-17 NOTE — Plan of Care (Signed)
Problem: Activity: Goal: Imbalance in normal sleep/wake cycle will improve Outcome: Progressing Pt. states he has been sleeping well, no PRNs needed at this time.

## 2016-05-17 NOTE — Progress Notes (Signed)
D: Patient seen in his room attending to his visitors. Later seen playing cards with peers on day room. Denies pain, SI, AH/VH at this time. Patient made no new complaint.  A: Staff encouraged patient to continue with the treatment plan and verbalize needs to client. Meds. given as ordered. Every 15 minutes check for safety maintained. Will continue to monitor patient for safety and stability.  R: Patient remains safe.

## 2016-05-17 NOTE — Progress Notes (Signed)
Patient ID: Jon Shaw, male   DOB: 04/16/1966, 50 y.o.   MRN: [id number] [facility] MD Progress Note  05/17/2016 3:18 PM Jon Shaw  MRN:  [id number] Subjective:  Reports feeling better, less significantly anxious . Denies medication side effects. States he has had supportive visits from friends, members of his church , which have helped him feel better, and that wife, although angry, has been supportive as well .report feeling tired after playing basketball outside in the sun. BP 127/96 AND 136/96 PULSE 92. He states he has had no suicidal ideations, and is future oriented, states he is interested in exploring treatment venues to address sexual addiction , and states that he has been thinking about retaining a Chief Executive Officer .Met with Social Worker today who is arranging a family meeting with patient's wife prior to discharge.     Objective : I have met with patient and have  Reviewed chart notes . Patient is in bed and in no apparent distress   At this time he is future oriented, states his wife is helping him determine if there is any current legal consequences of recent allegations and how to proceed.  On unit he has been calm, pleasant, somewhat anxious, visible in day room, no disruptive, no agitated and no inappropriate behaviors noted or reported .  Denies medication side effects.   Principal Problem:  Depression, adjustment disorder with depressed mood  Diagnosis:   Patient Active Problem List   Diagnosis Date Noted  . MDD (major depressive disorder), severe ([place]) [F32.2] 05/13/2016  . DYSLIPIDEMIA [E78.5] 03/09/2010  . GERD [K21.9] 03/09/2010  . CHEST PAIN [R07.9] 03/09/2010   Total Time spent with patient: 20 minutes     Past Medical History:  Past Medical History:  Diagnosis Date  . Chest tightness    troponin elevation 0.4. cath [facility] 03/03/10 - normal  . Depression   . GERD (gastroesophageal reflux disease)   . History of cardiac catheterization    EF 60% 6/11  .  Hypertriglyceridemia     Past Surgical History:  Procedure Laterality Date  . CARDIAC CATHETERIZATION     Family History: History reviewed. No pertinent family history.  Social History:  History  Alcohol Use No     History  Drug Use No    Social History   Social History  . Marital status: Married    Spouse name: N/A  . Number of children: N/A  . Years of education: N/A   Social History Main Topics  . Smoking status: Never Smoker  . Smokeless tobacco: Never Used  . Alcohol use No  . Drug use: No  . Sexual activity: No   Other Topics Concern  . None   Social History Narrative   Married, 2 children.   [employer] but currently out of work.    Additional Social History:    Pain Medications: NA  Sleep: Good  Appetite: good   Current Medications: Current Facility-Administered Medications  Medication Dose Route Frequency Provider Last Rate Last Dose  . acetaminophen (TYLENOL) tablet 650 mg  650 mg Oral Q6H PRN Laverle Hobby, PA-C      . alum & mag hydroxide-simeth (MAALOX/MYLANTA) 200-200-20 MG/5ML suspension 30 mL  30 mL Oral Q4H PRN Laverle Hobby, PA-C      . aspirin tablet 325 mg  325 mg Oral Daily Laverle Hobby, PA-C   325 mg at 05/17/16 0809  . buPROPion (WELLBUTRIN XL) 24 hr tablet 150 mg  150 mg Oral Daily Frederico Hamman  E Simon, PA-C   150 mg at 05/17/16 0809  . busPIRone (BUSPAR) tablet 10 mg  10 mg Oral TID Jenne Campus, MD   10 mg at 05/17/16 1209  . hydrOXYzine (ATARAX/VISTARIL) tablet 25 mg  25 mg Oral Q6H PRN Laverle Hobby, PA-C   25 mg at 05/14/16 2120  . magnesium hydroxide (MILK OF MAGNESIA) suspension 30 mL  30 mL Oral Daily PRN Laverle Hobby, PA-C      . nitroGLYCERIN (NITROSTAT) SL tablet 0.4 mg  0.4 mg Sublingual Q5 min PRN Laverle Hobby, PA-C      . pantoprazole (PROTONIX) EC tablet 40 mg  40 mg Oral Daily Laverle Hobby, PA-C   40 mg at 05/17/16 0809  . rosuvastatin (CRESTOR) tablet 10 mg  10 mg Oral Daily Laverle Hobby, PA-C    10 mg at 05/15/16 1701  . traZODone (DESYREL) tablet 50 mg  50 mg Oral QHS PRN Jenne Campus, MD   50 mg at 05/14/16 2120    Lab Results: No results found for this or any previous visit (from the past 48 hour(s)).  Blood Alcohol level:  Lab Results  Component Value Date   ETH <5 50/27/7412    Metabolic Disorder Labs:  No results found for: PROLACTIN Lab Results  Component Value Date   CHOL  03/03/2010    140        ATP III CLASSIFICATION:  <200     mg/dL   Desirable  200-239  mg/dL   Borderline High  >=240    mg/dL   High          TRIG 194 (H) 03/03/2010   HDL 31 (L) 03/03/2010   CHOLHDL 4.5 03/03/2010   VLDL 39 03/03/2010   LDLCALC  03/03/2010    70        Total Cholesterol/HDL:CHD Risk Coronary Heart Disease Risk Table                     Men   Women  1/2 Average Risk   3.4   3.3  Average Risk       5.0   4.4  2 X Average Risk   9.6   7.1  3 X Average Risk  23.4   11.0        Use the calculated Patient Ratio above and the CHD Risk Table to determine the patient's CHD Risk.        ATP III CLASSIFICATION (LDL):  <100     mg/dL   Optimal  100-129  mg/dL   Near or Above                    Optimal  130-159  mg/dL   Borderline  160-189  mg/dL   High  >190     mg/dL   Very High    Physical Findings: AIMS: Facial and Oral Movements Muscles of Facial Expression: None, normal Lips and Perioral Area: None, normal Jaw: None, normal Tongue: None, normal,Extremity Movements Upper (arms, wrists, hands, fingers): None, normal Lower (legs, knees, ankles, toes): None, normal, Trunk Movements Neck, shoulders, hips: None, normal, Overall Severity Severity of abnormal movements (highest score from questions above): None, normal Incapacitation due to abnormal movements: None, normal Patient's awareness of abnormal movements (rate only patient's report): No Awareness, Dental Status Current problems with teeth and/or dentures?: No Does patient usually wear dentures?: No   CIWA:    COWS:  Musculoskeletal: Strength & Muscle Tone: within normal limits Gait & Station: normal Patient leans: N/A  Psychiatric Specialty Exam: Physical Exam  ROS denies headache, denies chest pain, no shortness of breath, no nausea, no vomiting   Blood pressure 139/79, pulse 92, temperature 98.5 F (36.9 C), temperature source Oral, resp. rate 20, height 6' 3" (1.905 m), weight 85.7 kg (189 lb).Body mass index is 23.62 kg/m.  General Appearance: Well Groomed  Eye Contact:  Good  Speech:  Normal Rate  Volume:  Normal  Mood:  Improved mood   Affect:  Smiles at times appropriately, more reactive, still tends to be anxious, but some improvement compared to admission presentation  Thought Process:  Linear  Orientation:  Full (Time, Place, and Person)  Thought Content:  no hallucinations, no delusions, not internally preoccupied   Suicidal Thoughts:  No denies any suicidal ideations, and denies any self injurious ideations, contracts for safety on the unit  Homicidal Thoughts:  No also specifically denies any violent or homicidal ideations towards any family member or the minor who accused him   Memory:  recent and remote grossly intact   Judgement:  Improving   Insight:  Fair  Psychomotor Activity:  Normal  Concentration:  Concentration: Good and Attention Span: Good  Recall:  Good  Fund of Knowledge:  Good  Language:  Good  Akathisia:  Negative  Handed:  Right  AIMS (if indicated):     Assets:  Desire for Improvement Resilience  ADL's:  Intact  Cognition:  WNL  Sleep:  Number of Hours: 6   Assessment - patient reports improved mood, denies severe neuro-vegetative symptoms of depression, and presents with a more reactive, but still anxious, affect . Future oriented, focusing on disposition planning and on possible legal issues he may be facing . Tolerating medications well . Reports he feels Buspar has been " really helpful".      Treatment Plan Summary: Daily  contact with patient to assess and evaluate symptoms and progress in treatment, Medication management, Plan inpatient admission ' and medications as below  Continue to encourage group , milieu participation to work on coping skills and symptom reduction  Continue Wellbutrin XL 150 mgrs QDAY for depression  Increase   Buspar to 10  mgrs TID for anxiety and augmentation  Continue Vistaril  25 mgrs Q 6 H PRNs for anxiety , as needed  Continue Trazodone 50 mgrs QHS PRN for insomnia as needed  Treatment team working on discharge planning  Ruffin Frederick, MD 05/17/2016, 3:18 PM

## 2016-05-17 NOTE — Progress Notes (Signed)
Recreation Therapy Notes  Date: 05/17/16 Time: 0930 Location: 300 Hall Dayroom  Group Topic: Stress Management  Goal Area(s) Addresses:  Patient will verbalize importance of using healthy stress management.  Patient will identify positive emotions associated with healthy stress management.   Intervention: Stress Management  Activity :  [place].  LRT introduced to the technique of guided imagery to the patients.  Patients were to follow along as LRT read script in order to participate in the the technique.  Education: Stress Management, Discharge Planning.   Education Outcome: Acknowledges edcuation/In group clarification offered/Needs additional education  Clinical Observations/Feedback: Pt did not attend group.    Aizley Stenseth, LRT/CTRS     Lyall Faciane A 05/17/2016 12:55 PM 

## 2016-05-17 NOTE — [facility] Suicide Risk Assessment (Signed)
[facility] INPATIENT:  Family/Significant Other Suicide Prevention Education  Suicide Prevention Education:  Education Completed; Mitzie NaDonna Pankowski, Pt's wife [phone],  has been identified by the patient as the family member/significant other with whom the patient will be residing, and identified as the person(s) who will aid the patient in the event of a mental health crisis (suicidal ideations/suicide attempt).  With written consent from the patient, the family member/significant other has been provided the following suicide prevention education, prior to the and/or following the discharge of the patient.  The suicide prevention education provided includes the following:  Suicide risk factors  Suicide prevention and interventions  National Suicide Hotline telephone number  [facility] assessment telephone number  [facility] Assistance 911  [facility] and/or [facility] telephone number  Request made of family/significant other to:  Remove weapons (e.g., guns, rifles, knives), all items previously/currently identified as safety concern.    Remove drugs/medications (over-the-counter, prescriptions, illicit drugs), all items previously/currently identified as a safety concern.  The family member/significant other verbalizes understanding of the suicide prevention education information provided.  The family member/significant other agrees to remove the items of safety concern listed above.  Elaina HoopsLauren M Carter 05/17/2016, 4:05 PM

## 2016-05-17 NOTE — [facility] Group Notes (Signed)
[facility] LCSW Group Therapy  05/17/2016 1:15pm  Type of Therapy:  Group Therapy vercoming Obstacles  Participation Level:  Active  Participation Quality:  Appropriate   Affect:  Appropriate  Cognitive:  Appropriate and Oriented  Insight:  Developing/Improving and Improving  Engagement in Therapy:  Improving  Modes of Intervention:  Discussion, Exploration, Problem-solving and Support  Description of Group:   In this group patients will be encouraged to explore what they see as obstacles to their own wellness and recovery. They will be guided to discuss their thoughts, feelings, and behaviors related to these obstacles. The group will process together ways to cope with barriers, with attention given to specific choices patients can make. Each patient will be challenged to identify changes they are motivated to make in order to overcome their obstacles. This group will be process-oriented, with patients participating in exploration of their own experiences as well as giving and receiving support and challenge from other group members.  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 He discussed how this causes him to feel grief due to his father's behavioral changes. He was able to identify ways in which he can foster resiliency and acceptance related to this obstacle.    Therapeutic Modalities:   Cognitive Behavioral Therapy Solution Focused Therapy Motivational Interviewing Relapse Prevention Therapy   Chad CordialLauren Carter, LCSWA 05/17/2016 4:00 PM

## 2016-05-17 NOTE — Progress Notes (Signed)
Adult Psychoeducational Group Note  Date:  05/17/2016 Time:  9:15 PM  Group Topic/Focus:  Wrap-Up Group:   The focus of this group is to help patients review their daily goal of treatment and discuss progress on daily workbooks.   Participation Level:  Active  Participation Quality:  Appropriate  Affect:  Appropriate  Cognitive:  Alert  Insight: Appropriate  Engagement in Group:  Engaged  Modes of Intervention:  Discussion  Additional Comments: Patient states, "my day started off bad, because I was suppose to leave today". Patient states he will be discharging tomorrow.  Mikah Poss L Laporscha Linehan 05/17/2016, 9:15 PM

## 2016-05-17 NOTE — Progress Notes (Signed)
Adult Psychoeducational Group Note  Date:  05/17/2016 Time:  10:45 AM  Group Topic/Focus:  Goals Group:   The focus of this group is to help patients establish daily goals to achieve during treatment and discuss how the patient can incorporate goal setting into their daily lives to aide in recovery.   Participation Level:  Active  Participation Quality:  Appropriate  Affect:  Appropriate  Cognitive:  Alert  Insight: Appropriate  Engagement in Group:  Engaged  Modes of Intervention:  Discussion  Additional Comments:  Pt did participate in group this morning.  Pt stated that he is feeling a little anxious today, possibly because him and his family will be meeting with the doctor later this afternoon.  Pt states that family is supportive and he would like to go to outpatient therapy after he discharges. Syrah Daughtrey R Oluwatomiwa Kinyon 05/17/2016, 10:45 AM

## 2016-05-17 NOTE — Progress Notes (Signed)
Patient ID: Jon ConnersRodney Shaw, male   DOB: 03-01-1966, 50 y.o.   MRN: [id number]  DAR: Pt. Denies SI/HI and A/V Hallucinations. He reports anxiety but states, "I am going to talk to a lawyer today." He reports sleep was good last night, appetite is good, energy level is normal, and concentration is good. He rates depression 2/10, hopelessness 1/10, and anxiety 3/10. Patient does not report any pain or discomfort at this time. Support and encouragement provided to the patient to come to writer if patient needs anything. He came to Clinical research associatewriter shortly after going outside and stated that he was feeling dizzy. Patients vital signs were WDL. Patient was given Gatorade and encouraged to drink plenty of fluids due to possible dehydration related to playing basketball during therapeutic recreation. Patient reported later that he felt better and the dizziness was gone. Scheduled medications administered to patient per physician's orders. Patient is minimal with Clinical research associatewriter but cooperative. He is seen in the milieu and is attending some groups. Q15 minute checks are maintained for safety.

## 2016-05-17 NOTE — Progress Notes (Deleted)
D: Patient seen interacting with peers on day hall. Appear cheerful and happy. Patient stated he's a little nervous going home tomorrow but looking forward towards that. Denies pain, SI, ah/vh at this time. No behavioral issues noted.  A: Staff encouraged patient to continue with the treatment plan and verbalize needs to staff. Due meds given as ordered. Every 15 minutes check for safety maintained. Will continue to monitor patient for safety and stability. R: Patient remains safe.

## 2016-05-17 NOTE — [facility] Group Notes (Signed)
[facility]   05/17/2016 1:03 PM  Participation Quality:  appropriate  Mood/Affect:  Appropriate    Thoughts of Suicide:  No Will you contract for safety?   NA  Current AVH:  No  Plan for Discharge/Comments:  Return home w wife, [facility], PCP for medications.   Transportation Means: family  Supports: states wife is supportive  Sallee Langenne C Aftin Lye

## 2016-05-17 NOTE — Plan of Care (Signed)
Problem: Activity: Goal: Interest or engagement in leisure activities will improve Outcome: Progressing Patient attended therapeutic recreation time this morning.

## 2016-05-18 MED ORDER — BUSPIRONE HCL 10 MG PO TABS: 10.0000 mg | ORAL_TABLET | Freq: Three times a day (TID) | ORAL | 0 refills | Status: DC

## 2016-05-18 MED ORDER — HYDROXYZINE HCL 25 MG PO TABS: 25.0000 mg | ORAL_TABLET | Freq: Four times a day (QID) | ORAL | 0 refills | Status: DC | PRN

## 2016-05-18 MED ORDER — TRAZODONE HCL 50 MG PO TABS: 50.0000 mg | ORAL_TABLET | Freq: Every evening | ORAL | 0 refills | Status: DC | PRN

## 2016-05-18 MED ORDER — BUPROPION HCL ER (XL) 150 MG PO TB24: 150.0000 mg | ORAL_TABLET | Freq: Every day | ORAL | 0 refills | Status: AC

## 2016-05-18 MED ORDER — PANTOPRAZOLE SODIUM 40 MG PO TBEC: 40.0000 mg | DELAYED_RELEASE_TABLET | Freq: Every day | ORAL | 0 refills | Status: DC

## 2016-05-18 MED ORDER — ROSUVASTATIN CALCIUM 10 MG PO TABS: 10.0000 mg | ORAL_TABLET | Freq: Every day | ORAL | 0 refills | Status: DC

## 2016-05-18 NOTE — Tx Team (Signed)
Interdisciplinary Treatment and Diagnostic Plan Update  05/18/2016 Time of Session: 10:39 AM  Jon Shaw MRN: [id number]  Principal Diagnosis:   MDD (major depressive disorder), severe ([place])  Secondary Diagnoses: Active Problems:   MDD (major depressive disorder), severe ([facility])   Current Medications:  Current Facility-Administered Medications  Medication Dose Route Frequency Provider Last Rate Last Dose  . acetaminophen (TYLENOL) tablet 650 mg  650 mg Oral Q6H PRN Laverle Hobby, PA-C      . alum & mag hydroxide-simeth (MAALOX/MYLANTA) 200-200-20 MG/5ML suspension 30 mL  30 mL Oral Q4H PRN Laverle Hobby, PA-C      . aspirin tablet 325 mg  325 mg Oral Daily Laverle Hobby, PA-C   325 mg at 05/18/16 0757  . buPROPion (WELLBUTRIN XL) 24 hr tablet 150 mg  150 mg Oral Daily Laverle Hobby, PA-C   150 mg at 05/18/16 0756  . busPIRone (BUSPAR) tablet 10 mg  10 mg Oral TID Jenne Campus, MD   10 mg at 05/18/16 0756  . hydrOXYzine (ATARAX/VISTARIL) tablet 25 mg  25 mg Oral Q6H PRN Laverle Hobby, PA-C   25 mg at 05/18/16 0800  . magnesium hydroxide (MILK OF MAGNESIA) suspension 30 mL  30 mL Oral Daily PRN Laverle Hobby, PA-C      . nitroGLYCERIN (NITROSTAT) SL tablet 0.4 mg  0.4 mg Sublingual Q5 min PRN Laverle Hobby, PA-C      . pantoprazole (PROTONIX) EC tablet 40 mg  40 mg Oral Daily Laverle Hobby, PA-C   40 mg at 05/18/16 0756  . rosuvastatin (CRESTOR) tablet 10 mg  10 mg Oral Daily Laverle Hobby, PA-C   10 mg at 05/17/16 1703  . traZODone (DESYREL) tablet 50 mg  50 mg Oral QHS PRN Jenne Campus, MD   50 mg at 05/14/16 2120   PTA Medications: Prescriptions Prior to Admission  Medication Sig Dispense Refill Last Dose  . aspirin 325 MG tablet Take 325 mg by mouth daily.     05/12/2016 at Unknown time  . buPROPion (WELLBUTRIN XL) 150 MG 24 hr tablet    05/12/2016 at Unknown time  . omeprazole (PRILOSEC) 20 MG capsule Take 20 mg by mouth daily.     05/12/2016 at Unknown time   . rosuvastatin (CRESTOR) 10 MG tablet Take 10 mg by mouth daily.     05/12/2016 at Unknown time  . nitroGLYCERIN (NITROSTAT) 0.4 MG SL tablet Place 0.4 mg under the tongue every 5 (five) minutes as needed.     Unknown at Unknown time    Treatment Modalities: Medication Management, Group therapy, Case management,  1 to 1 session with clinician, Psychoeducation, Recreational therapy.   Physician Treatment Plan for Primary Diagnosis:   MDD (major depressive disorder), severe ([place]) Long Term Goal(s): Improvement in symptoms so as ready for discharge   Short Term Goals: Ability to verbalize feelings will improve, Ability to demonstrate self-control will improve and Ability to identify and develop effective coping behaviors will improve  Medication Management: Evaluate patient's response, side effects, and tolerance of medication regimen.  Therapeutic Interventions: 1 to 1 sessions, Unit Group sessions and Medication administration.  Evaluation of Outcomes: Adequate for Discharge  Physician Treatment Plan for Secondary Diagnosis: Active Problems:   MDD (major depressive disorder), severe ([place])  Long Term Goal(s): Improvement in symptoms so as ready for discharge  Short Term Goals: Ability to verbalize feelings will improve, Ability to demonstrate self-control will improve and Ability to  identify and develop effective coping behaviors will improve  Medication Management: Evaluate patient's response, side effects, and tolerance of medication regimen.  Therapeutic Interventions: 1 to 1 sessions, Unit Group sessions and Medication administration.  Evaluation of Outcomes: Adequate for Discharge   RN Treatment Plan for Primary Diagnosis:   MDD (major depressive disorder), severe ([place]) Long Term Goal(s): Knowledge of disease and therapeutic regimen to maintain health will improve  Short Term Goals: Ability to remain free from injury will improve, Ability to demonstrate self-control and  Ability to participate in decision making will improve  Medication Management: RN will administer medications as ordered by provider, will assess and evaluate patient's response and provide education to patient for prescribed medication. RN will report any adverse and/or side effects to prescribing provider.  Therapeutic Interventions: 1 on 1 counseling sessions, Psychoeducation, Medication administration, Evaluate responses to treatment, Monitor vital signs and CBGs as ordered, Perform/monitor CIWA, COWS, AIMS and Fall Risk screenings as ordered, Perform wound care treatments as ordered.  Evaluation of Outcomes: Adequate for Discharge   LCSW Treatment Plan for Primary Diagnosis:   MDD (major depressive disorder), severe ([place]) Long Term Goal(s): Safe transition to appropriate next level of care at discharge, Engage patient in therapeutic group addressing interpersonal concerns.  Short Term Goals: Engage patient in aftercare planning with referrals and resources, Increase ability to appropriately verbalize feelings and Increase skills for wellness and recovery  Therapeutic Interventions: Assess for all discharge needs, 1 to 1 time with Social worker, Explore available resources and support systems, Assess for adequacy in community support network, Educate family and significant other(s) on suicide prevention, Complete Psychosocial Assessment, Interpersonal group therapy.  Evaluation of Outcomes: Met   Progress in Treatment: Attending groups: Yes Participating in groups: Yes Taking medication as prescribed: Yes, MD continuing to assess for appropriate medication regimen Toleration medication: Yes Family/Significant other contact made: Yes with wife Patient understands diagnosis: Yes AEB seeking help with depression Discussing patient identified problems/goals with staff: Yes Medical problems stabilized or resolved: Yes Denies suicidal/homicidal ideation: Yes Issues/concerns per patient  self-inventory: None reported Other: N/A  New problem(s) identified: None reported at this time  New Short Term/Long Term Goal(s): None at this time  Discharge Plan or Barriers: Pt will return home and follow-up with outpatient services.   Reason for Continuation of Hospitalization:  Anxiety Depression Medication stabilization Suicidal ideation  Estimated Length of Stay: 0 days  Attendees: Patient:   Physician: Dr. Ananias Pilgrim, MD 05/18/2016 10:39 AM  Nursing: Mayra Neer, RN; Loletta Specter, RN 05/18/2016 10:39 AM  RN Care Manager: Lars Pinks 05/18/2016 10:39 AM  Social Worker: Peri Maris, LCSW 05/18/2016 10:39 AM  Social Worker: Tilden Fossa, LCSW 05/18/2016 10:39 AM  Other: Samuel Jester, NP; Lindell Spar, NP 05/18/2016 10:39 AM  Other:  05/18/2016 10:39 AM  Other: 05/18/2016 10:39 AM    Scribe for Treatment Team: Bo Mcclintock, LCSW 05/18/2016 10:39 AM

## 2016-05-18 NOTE — [facility] Group Notes (Signed)
[facility] Group Notes:  (Nursing/MHT/Case Management/Adjunct)  Date:  05/18/2016  Time:  0900 am  Type of Therapy:  Psychoeducational Skills  Participation Level:  Active  Participation Quality:  Appropriate and Attentive  Affect:  Appropriate  Cognitive:  Alert and Appropriate  Insight:  Appropriate and Improving  Engagement in Group:  Improving  Modes of Intervention:  Support  Summary of Progress/Problems: Patient is scheduled to be discharge today.  His family meeting with his wife is at 1100.  Patient asked appropriate questions and listened attentively.  Cranford MonBeaudry, Dryden Tapley Evans 05/18/2016, 10:14 AM

## 2016-05-18 NOTE — Progress Notes (Signed)
Patient ID: Jon ConnersRodney Shaw, male   DOB: 11/24/1965, 50 y.o.   MRN: [id number] Discharge note:  Patient discharged home per MD order.  Patient received all personal belongings from unit and locker.  He denies any thoughts of self harm.  He denies HI/AVH. Reviewed discharge instructions and medications with patient and he indicated understanding.  Patient encouraged to follow up with his outpatient providers.  Patient's spouse was present today for family session.  Patient left ambulatory with his wife.

## 2016-05-18 NOTE — Discharge Summary (Signed)
Physician Discharge Summary Note  Patient:  Jon Shaw is an 50 y.o., male MRN:  [id number] DOB:  Jul 05, 1966 Patient phone:  [phone] (home)  Patient address:   [address] [place] [address],  Total Time spent with patient: 30 minutes  Date of Admission:  05/12/2016 Date of Discharge: 05/18/2016  Reason for Admission:  Suicidal ideation  Principal Problem: MDD (major depressive disorder), severe [facility](HCC) Discharge Diagnoses: Patient Active Problem List   Diagnosis Date Noted  . MDD (major depressive disorder), severe (HCC) [F32.2] 05/13/2016    Priority: High  . DYSLIPIDEMIA [E78.5] 03/09/2010  . GERD [K21.9] 03/09/2010  . CHEST PAIN [R07.9] 03/09/2010    Past Psychiatric History: see HPI  Past Medical History:  Past Medical History:  Diagnosis Date  . Chest tightness    troponin elevation 0.4. cath [facility] 03/03/10 - normal  . Depression   . GERD (gastroesophageal reflux disease)   . History of cardiac catheterization    EF 60% 6/11  . Hypertriglyceridemia     Past Surgical History:  Procedure Laterality Date  . CARDIAC CATHETERIZATION     Family History: History reviewed. No pertinent family history. Family Psychiatric  History: see HPI Social History:  History  Alcohol Use No     History  Drug Use No    Social History   Social History  . Marital status: Married    Spouse name: N/A  . Number of children: N/A  . Years of education: N/A   Social History Main Topics  . Smoking status: Never Smoker  . Smokeless tobacco: Never Used  . Alcohol use No  . Drug use: No  . Sexual activity: No   Other Topics Concern  . None   Social History Narrative   Married, 2 children.   [employer] but currently out of work.     Hospital Course:  Jon ConnersRodney Shaw, 50 yo came in after worsening depression, developing suicidal ideations.     Jon ConnersRodney Shaw was admitted for MDD (major depressive disorder), severe (HCC) and crisis management.  He was treated  with meds listed below and their indications.  Medical problems were identified and treated as needed.  Home medications were restarted as appropriate.  Improvement was monitored by observation and Jon Connersodney Shaw daily report of symptom reduction.  Emotional and mental status was monitored by daily self inventory reports completed by Jon Connersodney Shaw and clinical staff.  Patient reported continued improvement, denied any new concerns.  Patient had been compliant on medications and denied side effects.  Support and encouragement was provided.    At time of discharge, patient rated both depression and anxiety levels to be manageable and minimal.  Patient encouraged to attend groups to help with recognizing triggers of emotional crises and de-stabilizations.  Patient encouraged to attend group to help identify the positive things in life that would help in dealing with feelings of loss, depression and unhealthy or abusive tendencies.         Jon ConnersRodney Shaw was evaluated by the treatment team for stability and plans for continued recovery upon discharge.  He was offered further treatment options upon discharge including Residential, Intensive Outpatient and Outpatient treatment.  He will follow up with agencies listed below for medication management and counseling.  Encouraged patient to maintain satisfactory support network and home environment.  Advised to adhere to medication compliance and outpatient treatment follow up.  Prescriptions provided.       Jon ConnersRodney Shaw motivation was an integral factor for scheduling further treatment.  Employment, transportation, bed availability, health status, family support, and any pending legal issues were also considered during his hospital stay.  Upon completion of this admission the patient was both mentally and medically stable for discharge denying suicidal/homicidal ideation, auditory/visual/tactile hallucinations, delusional thoughts and paranoia.      Physical  Findings: AIMS: Facial and Oral Movements Muscles of Facial Expression: None, normal Lips and Perioral Area: None, normal Jaw: None, normal Tongue: None, normal,Extremity Movements Upper (arms, wrists, hands, fingers): None, normal Lower (legs, knees, ankles, toes): None, normal, Trunk Movements Neck, shoulders, hips: None, normal, Overall Severity Severity of abnormal movements (highest score from questions above): None, normal Incapacitation due to abnormal movements: None, normal Patient's awareness of abnormal movements (rate only patient's report): No Awareness, Dental Status Current problems with teeth and/or dentures?: No Does patient usually wear dentures?: No  CIWA:    COWS:     Musculoskeletal: Strength & Muscle Tone: within normal limits Gait & Station: normal Patient leans: N/A  Psychiatric Specialty Exam:  See MD SRA Physical Exam  Vitals reviewed. Psychiatric: He has a normal mood and affect. His speech is normal and behavior is normal. Judgment and thought content normal. Cognition and memory are normal.    Review of Systems  Constitutional: Negative.   HENT: Negative.   Eyes: Negative.   Respiratory: Negative.   Cardiovascular: Negative.   Gastrointestinal: Negative.   Genitourinary: Negative.   Musculoskeletal: Negative.   Skin: Negative.   Neurological: Negative.   Endo/Heme/Allergies: Negative.   Psychiatric/Behavioral: Negative.     Blood pressure (!) 155/85, pulse 86, temperature 97.5 F (36.4 C), temperature source Oral, resp. rate 18, height 6\' 3"  (1.905 m), weight 85.7 kg (189 lb).Body mass index is 23.62 kg/m.    Have you used any form of tobacco in the last 30 days? (Cigarettes, Smokeless Tobacco, Cigars, and/or Pipes): No  Has this patient used any form of tobacco in the last 30 days? (Cigarettes, Smokeless Tobacco, Cigars, and/or Pipes) Yes, N/A  Blood Alcohol level:  Lab Results  Component Value Date   ETH <5 05/12/2016    Metabolic  Disorder Labs:  Lab Results  Component Value Date   HGBA1C (H) 03/03/2010    5.7 (NOTE)                                                                       According to the ADA Clinical Practice Recommendations for 2011, when HbA1c is used as a screening test:   >=6.5%   Diagnostic of Diabetes Mellitus           (if abnormal result  is confirmed)  5.7-6.4%   Increased risk of developing Diabetes Mellitus  References:Diagnosis and Classification of Diabetes Mellitus,Diabetes Care,2011,34(Suppl 1):S62-S69 and Standards of Medical Care in         Diabetes - 2011,Diabetes Care,2011,34  (Suppl 1):S11-S61.   MPG 117 (H) 03/03/2010   No results found for: PROLACTIN Lab Results  Component Value Date   CHOL  03/03/2010    140        ATP III CLASSIFICATION:  <200     mg/dL   Desirable  161-096  mg/dL   Borderline High  >=045    mg/dL   High  TRIG 194 (H) 03/03/2010   HDL 31 (L) 03/03/2010   CHOLHDL 4.5 03/03/2010   VLDL 39 03/03/2010   LDLCALC  03/03/2010    70        Total Cholesterol/HDL:CHD Risk Coronary Heart Disease Risk Table                     Men   Women  1/2 Average Risk   3.4   3.3  Average Risk       5.0   4.4  2 X Average Risk   9.6   7.1  3 X Average Risk  23.4   11.0        Use the calculated Patient Ratio above and the CHD Risk Table to determine the patient's CHD Risk.        ATP III CLASSIFICATION (LDL):  <100     mg/dL   Optimal  161-096  mg/dL   Near or Above                    Optimal  130-159  mg/dL   Borderline  045-409  mg/dL   High  >811     mg/dL   Very High    See Psychiatric Specialty Exam and Suicide Risk Assessment completed by Attending Physician prior to discharge.  Discharge destination:  Home  Is patient on multiple antipsychotic therapies at discharge:  No   Has Patient had three or more failed trials of antipsychotic monotherapy by history:  No  Recommended Plan for Multiple Antipsychotic Therapies: NA     Medication List     STOP taking these medications   aspirin 325 MG tablet   nitroGLYCERIN 0.4 MG SL tablet Commonly known as:  NITROSTAT   omeprazole 20 MG capsule Commonly known as:  PRILOSEC Replaced by:  pantoprazole 40 MG tablet     TAKE these medications     Indication  buPROPion 150 MG 24 hr tablet Commonly known as:  WELLBUTRIN XL Take 1 tablet (150 mg total) by mouth daily. What changed:  how much to take  how to take this  when to take this  Indication:  Major Depressive Disorder   busPIRone 10 MG tablet Commonly known as:  BUSPAR Take 1 tablet (10 mg total) by mouth 3 (three) times daily.  Indication:  Depression, Generalized Anxiety Disorder   hydrOXYzine 25 MG tablet Commonly known as:  ATARAX/VISTARIL Take 1 tablet (25 mg total) by mouth every 6 (six) hours as needed for anxiety.  Indication:  Anxiety Neurosis   pantoprazole 40 MG tablet Commonly known as:  PROTONIX Take 1 tablet (40 mg total) by mouth daily. Replaces:  omeprazole 20 MG capsule  Indication:  Gastroesophageal Reflux Disease   rosuvastatin 10 MG tablet Commonly known as:  CRESTOR Take 1 tablet (10 mg total) by mouth daily.  Indication:  High Amount of Fats in the Blood   traZODone 50 MG tablet Commonly known as:  DESYREL Take 1 tablet (50 mg total) by mouth at bedtime as needed for sleep.  Indication:  Trouble Sleeping      Follow-up Information    [facility] .   Why:  Initial appointment for assessment on Friday September 1st at 9 AM w Lourena Simmonds.  Call to cancel/reschedule if needed.  Please call provider to confirm time/date of appt. Contact information: [address] [place], [place] [address] Phone: [phone] Fax: [phone]  [facility] Follow up on 06/02/2016.   Why:  at 9:15am for medication management with Dr. Margo Common. Contact information: [address] [place], [place] [address] Phone: [phone] Fax: [phone]           Follow-up recommendations:  Activity:  as tol Diet:  as tol  Comments:  1.  Take all your medications as prescribed.   2.  Report any adverse side effects to outpatient provider. 3.  Patient instructed to not use alcohol or illegal drugs while on prescription medicines. 4.  In the event of worsening symptoms, instructed patient to call 911, the crisis hotline or go to nearest emergency room for evaluation of symptoms.  Signed: Lindwood Qua, NP [facility] 05/18/2016, 1:50 PM

## 2016-05-18 NOTE — [facility] Suicide Risk Assessment (Signed)
[facility]   Principal Problem: <principal problem not specified> Discharge Diagnoses:  Patient Active Problem List   Diagnosis Date Noted  . MDD (major depressive disorder), severe (HCC) [F32.2] 05/13/2016  . DYSLIPIDEMIA [E78.5] 03/09/2010  . GERD [K21.9] 03/09/2010  . CHEST PAIN [R07.9] 03/09/2010    Total Time spent with patient: 30 minutes  Musculoskeletal: Strength & Muscle Tone: within normal limits Gait & Station: normal Patient leans: N/A  Psychiatric Specialty Exam: ROS  Blood pressure (!) 161/95, pulse 86, temperature 97.5 F (36.4 C), temperature source Oral, resp. rate 18, height 6\' 3"  (1.905 m), weight 85.7 kg (189 lb).Body mass index is 23.62 kg/m.  General Appearance: Casual and Fairly Groomed  Eye Contact::  Good  Speech:  Clear and Coherent and Normal Rate409  Volume:  Normal  Mood:  Euthymic  Affect:  Appropriate and Congruent  Thought Process:  Coherent and Goal Directed  Orientation:  Full (Time, Place, and Person)  Thought Content:  Logical  Suicidal Thoughts:  No  Homicidal Thoughts:  No  Memory:  Immediate;   Good Recent;   Good Remote;   Good  Judgement:  Intact  Insight:  Good  Psychomotor Activity:  Normal  Concentration:  Good  Recall:  Good  Fund of Knowledge:Good  Language: Good  Akathisia:  No  Handed:  Right  AIMS (if indicated):     Assets:  Communication Skills Desire for Improvement Housing Intimacy Physical Health Resilience Social Support Talents/Skills Transportation Vocational/Educational  Sleep:  Number of Hours: 6.75  Cognition: WNL  ADL's:  Intact   Mental Status Per Nursing Assessment::   On Admission:  Suicidal ideation indicated by patient, Suicide plan  Demographic Factors:  Male, Caucasian and Unemployed  Loss Factors: Decrease in vocational status and Legal issues  Historical Factors: Family history of mental illness or substance abuse  Risk Reduction Factors:   Sense  of responsibility to family, Religious beliefs about death, Living with another person, especially a relative, Positive social support and Positive coping skills or problem solving skills  Continued Clinical Symptoms:  None  Cognitive Features That Contribute To Risk:  None    Suicide Risk:  Minimal: No identifiable suicidal ideation.  Patients presenting with no risk factors but with morbid ruminations; may be classified as minimal risk based on the severity of the depressive symptoms  Follow-up Information    [facility] .   Why:  Initial appointment for assessment on Friday September 1st at 9 AM w Lourena SimmondsGreg LeTourneau.  Call to cancel/reschedule if needed.  Please call provider to confirm time/date of appt. Contact information: [address] [place], [place] [address] Phone: [phone] Fax: [phone]) [phone]        [facility] Follow up on 06/02/2016.   Why:  at 9:15am for medication management with Dr. Margo Commonapper. Contact information: [address] [place], [place] [address] Phone: [phone] Fax: [phone]          Plan Of Care/Follow-up recommendations:  Activity:  Follow up with appointments and take medications as prescribed  Wynelle BourgeoisUreh N Lekauwa, MD 05/18/2016, 10:06 AM

## 2016-05-18 NOTE — [facility] Group Notes (Signed)
Pt attended spiritual care group on grief and loss facilitated by chaplain Burnis KingfisherMatthew Alletta Mattos   Group opened with brief discussion and psycho-social ed around grief and loss in relationships and in relation to self - identifying life patterns, circumstances, changes that cause losses. Established group norm of speaking from own life experience. Group goal of establishing open and affirming space for members to share loss and experience with grief, normalize grief experience and provide psycho social education and grief support.   Group facilitation drew on Narrative, Adlerian, and brief CBT.    Thereasa DistanceRodney, who introduced himself as Loraine LericheMark, was present throughout group.  Alert and oriented x4, he was attentive to other group members.  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  Another group member connected with him around caregiving for dementia.     Loraine LericheMark stated he is leaving today and is feeling excited, but anxious.  He related that there are some "things I am anticipating that I do not feel comfortable talking about with others."   Loraine LericheMark stated he feels supported by others outside of the hospital setting.      Belva CromeStalnaker, Isobella Ascher Wayne MDiv

## 2016-05-18 NOTE — Progress Notes (Signed)
  [facility] :  Will you be returning to the same living situation after discharge:  Yes,  Pt returning home At discharge, do you have transportation home?: Yes,  Pt wife to transport Do you have the ability to pay for your medications: Yes,  Pt provided with prescriptions  Release of information consent forms completed and in the chart;  Patient's signature needed at discharge.  Patient to Follow up at: Follow-up Information    [facility] .   Why:  Initial appointment for assessment on Friday September 1st at 9 AM w Lourena SimmondsGreg LeTourneau.  Call to cancel/reschedule if needed.  Please call provider to confirm time/date of appt. Contact information: [address] [place], [place] [address] Phone: [phone]) [phone] Fax: [phone]        [facility] Follow up on 06/02/2016.   Why:  at 9:15am for medication management with Dr. Margo Commonapper. Contact information: [address] [place], [place] [address] Phone: [phone] Fax: [phone]          Next level of care provider has [facility] Link:no  Safety Planning and Suicide Prevention discussed: Yes,  with wife  Have you used any form of tobacco in the last 30 days? (Cigarettes, Smokeless Tobacco, Cigars, and/or Pipes): No  Has patient been referred to the Quitline?: N/A patient is not a smoker  Patient has been referred for addiction treatment: N/A  Elaina HoopsLauren M Carter 05/18/2016, 10:47 AM

## 2016-06-16 ENCOUNTER — Other Ambulatory Visit (HOSPITAL_COMMUNITY): Payer: Self-pay | Admitting: Psychiatry

## 2018-11-27 DIAGNOSIS — K219 Gastro-esophageal reflux disease without esophagitis: Secondary | ICD-10-CM | POA: Diagnosis not present

## 2018-11-27 DIAGNOSIS — Z130 Encounter for screening for diseases of the blood and blood-forming organs and certain disorders involving the immune mechanism: Secondary | ICD-10-CM | POA: Diagnosis not present

## 2018-11-27 DIAGNOSIS — F322 Major depressive disorder, single episode, severe without psychotic features: Secondary | ICD-10-CM | POA: Diagnosis not present

## 2018-11-27 DIAGNOSIS — H6121 Impacted cerumen, right ear: Secondary | ICD-10-CM | POA: Diagnosis not present

## 2018-11-27 DIAGNOSIS — Z0000 Encounter for general adult medical examination without abnormal findings: Secondary | ICD-10-CM | POA: Diagnosis not present

## 2018-11-27 DIAGNOSIS — E785 Hyperlipidemia, unspecified: Secondary | ICD-10-CM | POA: Diagnosis not present

## 2018-11-27 DIAGNOSIS — N529 Male erectile dysfunction, unspecified: Secondary | ICD-10-CM | POA: Diagnosis not present

## 2019-01-26 DIAGNOSIS — N529 Male erectile dysfunction, unspecified: Secondary | ICD-10-CM | POA: Diagnosis not present

## 2019-01-26 DIAGNOSIS — F322 Major depressive disorder, single episode, severe without psychotic features: Secondary | ICD-10-CM | POA: Diagnosis not present

## 2019-01-26 DIAGNOSIS — K219 Gastro-esophageal reflux disease without esophagitis: Secondary | ICD-10-CM | POA: Diagnosis not present

## 2019-06-25 ENCOUNTER — Encounter (HOSPITAL_COMMUNITY): Payer: Self-pay

## 2019-06-25 ENCOUNTER — Other Ambulatory Visit (HOSPITAL_COMMUNITY): Payer: Self-pay

## 2019-06-25 ENCOUNTER — Inpatient Hospital Stay (HOSPITAL_COMMUNITY)
Admission: EM | Admit: 2019-06-25 | Discharge: 2019-06-27 | DRG: 247 | Disposition: A | Discharge status: Home / Self Care | Payer: BC Managed Care – PPO | Attending: Cardiology | Admitting: Cardiology

## 2019-06-25 ENCOUNTER — Other Ambulatory Visit: Payer: Self-pay

## 2019-06-25 ENCOUNTER — Emergency Department (HOSPITAL_COMMUNITY): Payer: BC Managed Care – PPO

## 2019-06-25 DIAGNOSIS — Z7982 Long term (current) use of aspirin: Secondary | ICD-10-CM

## 2019-06-25 DIAGNOSIS — I25110 Atherosclerotic heart disease of native coronary artery with unstable angina pectoris: Secondary | ICD-10-CM | POA: Diagnosis present

## 2019-06-25 DIAGNOSIS — I200 Unstable angina: Secondary | ICD-10-CM

## 2019-06-25 DIAGNOSIS — I214 Non-ST elevation (NSTEMI) myocardial infarction: Secondary | ICD-10-CM | POA: Diagnosis not present

## 2019-06-25 DIAGNOSIS — Z79899 Other long term (current) drug therapy: Secondary | ICD-10-CM

## 2019-06-25 DIAGNOSIS — Z20828 Contact with and (suspected) exposure to other viral communicable diseases: Secondary | ICD-10-CM | POA: Diagnosis not present

## 2019-06-25 DIAGNOSIS — E781 Pure hyperglyceridemia: Secondary | ICD-10-CM | POA: Diagnosis present

## 2019-06-25 DIAGNOSIS — I10 Essential (primary) hypertension: Secondary | ICD-10-CM | POA: Diagnosis present

## 2019-06-25 DIAGNOSIS — I25118 Atherosclerotic heart disease of native coronary artery with other forms of angina pectoris: Secondary | ICD-10-CM

## 2019-06-25 DIAGNOSIS — Z882 Allergy status to sulfonamides status: Secondary | ICD-10-CM

## 2019-06-25 DIAGNOSIS — R42 Dizziness and giddiness: Secondary | ICD-10-CM | POA: Diagnosis not present

## 2019-06-25 DIAGNOSIS — E785 Hyperlipidemia, unspecified: Secondary | ICD-10-CM | POA: Diagnosis not present

## 2019-06-25 DIAGNOSIS — Z955 Presence of coronary angioplasty implant and graft: Secondary | ICD-10-CM

## 2019-06-25 DIAGNOSIS — Z8249 Family history of ischemic heart disease and other diseases of the circulatory system: Secondary | ICD-10-CM

## 2019-06-25 DIAGNOSIS — E7800 Pure hypercholesterolemia, unspecified: Secondary | ICD-10-CM | POA: Diagnosis not present

## 2019-06-25 DIAGNOSIS — R079 Chest pain, unspecified: Secondary | ICD-10-CM | POA: Diagnosis not present

## 2019-06-25 DIAGNOSIS — I252 Old myocardial infarction: Secondary | ICD-10-CM

## 2019-06-25 DIAGNOSIS — F329 Major depressive disorder, single episode, unspecified: Secondary | ICD-10-CM | POA: Diagnosis present

## 2019-06-25 DIAGNOSIS — K219 Gastro-esophageal reflux disease without esophagitis: Secondary | ICD-10-CM | POA: Diagnosis not present

## 2019-06-25 DIAGNOSIS — Z23 Encounter for immunization: Secondary | ICD-10-CM

## 2019-06-25 DIAGNOSIS — I2510 Atherosclerotic heart disease of native coronary artery without angina pectoris: Secondary | ICD-10-CM

## 2019-06-25 LAB — BASIC METABOLIC PANEL
Anion gap: 10 (ref 5–15)
BUN: 16 mg/dL (ref 6–20)
CO2: 26 mmol/L (ref 22–32)
Calcium: 9.1 mg/dL (ref 8.9–10.3)
Chloride: 105 mmol/L (ref 98–111)
Creatinine, Ser: 1.27 mg/dL — ABNORMAL HIGH (ref 0.61–1.24)
GFR calc Af Amer: >60 mL/min (ref >60)
GFR calc non Af Amer: >60 mL/min (ref >60)
Glucose, Bld: 92 mg/dL (ref 70–99)
Potassium: 3.2 mmol/L — ABNORMAL LOW (ref 3.5–5.1)
Sodium: 141 mmol/L (ref 135–145)

## 2019-06-25 LAB — CBC
HCT: 43.3 % (ref 39.0–52.0)
Hemoglobin: 14.5 g/dL (ref 13.0–17.0)
MCH: 29.6 pg (ref 26.0–34.0)
MCHC: 33.5 g/dL (ref 30.0–36.0)
MCV: 88.4 fL (ref 80.0–100.0)
Platelets: 249 10*3/uL (ref 150–400)
RBC: 4.9 MIL/uL (ref 4.22–5.81)
RDW: 12.5 % (ref 11.5–15.5)
WBC: 6.9 10*3/uL (ref 4.0–10.5)
nRBC: 0 % (ref 0.0–0.2)

## 2019-06-25 LAB — TROPONIN I (HIGH SENSITIVITY): Troponin I (High Sensitivity): 26 ng/L — ABNORMAL HIGH (ref <18)

## 2019-06-25 MED ORDER — SODIUM CHLORIDE 0.9% FLUSH: 3.0000 mL | Freq: Once | INTRAVENOUS | Status: DC

## 2019-06-25 NOTE — ED Triage Notes (Signed)
Pt states that he has been having CP for the past month, worse with exertion, pain radiates to L arm and back, denies n/v

## 2019-06-26 ENCOUNTER — Inpatient Hospital Stay (HOSPITAL_COMMUNITY): Payer: BC Managed Care – PPO

## 2019-06-26 ENCOUNTER — Encounter (HOSPITAL_COMMUNITY): Payer: Self-pay | Admitting: Emergency Medicine

## 2019-06-26 ENCOUNTER — Encounter (HOSPITAL_COMMUNITY)
Admission: EM | Disposition: A | Discharge status: Home / Self Care | Payer: Self-pay | Source: Home / Self Care | Attending: Cardiology

## 2019-06-26 ENCOUNTER — Other Ambulatory Visit: Payer: Self-pay

## 2019-06-26 DIAGNOSIS — Z8249 Family history of ischemic heart disease and other diseases of the circulatory system: Secondary | ICD-10-CM | POA: Diagnosis not present

## 2019-06-26 DIAGNOSIS — I25118 Atherosclerotic heart disease of native coronary artery with other forms of angina pectoris: Secondary | ICD-10-CM

## 2019-06-26 DIAGNOSIS — E785 Hyperlipidemia, unspecified: Secondary | ICD-10-CM | POA: Diagnosis present

## 2019-06-26 DIAGNOSIS — E7800 Pure hypercholesterolemia, unspecified: Secondary | ICD-10-CM | POA: Diagnosis not present

## 2019-06-26 DIAGNOSIS — I214 Non-ST elevation (NSTEMI) myocardial infarction: Secondary | ICD-10-CM | POA: Diagnosis present

## 2019-06-26 DIAGNOSIS — Z20828 Contact with and (suspected) exposure to other viral communicable diseases: Secondary | ICD-10-CM | POA: Diagnosis present

## 2019-06-26 DIAGNOSIS — I252 Old myocardial infarction: Secondary | ICD-10-CM | POA: Diagnosis not present

## 2019-06-26 DIAGNOSIS — K219 Gastro-esophageal reflux disease without esophagitis: Secondary | ICD-10-CM | POA: Diagnosis present

## 2019-06-26 DIAGNOSIS — I25110 Atherosclerotic heart disease of native coronary artery with unstable angina pectoris: Secondary | ICD-10-CM | POA: Diagnosis present

## 2019-06-26 DIAGNOSIS — I200 Unstable angina: Secondary | ICD-10-CM | POA: Diagnosis present

## 2019-06-26 DIAGNOSIS — I10 Essential (primary) hypertension: Secondary | ICD-10-CM | POA: Diagnosis present

## 2019-06-26 DIAGNOSIS — Z882 Allergy status to sulfonamides status: Secondary | ICD-10-CM | POA: Diagnosis not present

## 2019-06-26 DIAGNOSIS — I2510 Atherosclerotic heart disease of native coronary artery without angina pectoris: Secondary | ICD-10-CM

## 2019-06-26 DIAGNOSIS — Z23 Encounter for immunization: Secondary | ICD-10-CM | POA: Diagnosis not present

## 2019-06-26 HISTORY — PX: LEFT HEART CATH AND CORONARY ANGIOGRAPHY: CATH118249

## 2019-06-26 HISTORY — PX: CORONARY STENT INTERVENTION: CATH118234

## 2019-06-26 LAB — SARS CORONAVIRUS 2 BY RT PCR (HOSPITAL ORDER, PERFORMED IN ~~LOC~~ HOSPITAL LAB): SARS Coronavirus 2: NEGATIVE

## 2019-06-26 LAB — BASIC METABOLIC PANEL
Anion gap: 13 (ref 5–15)
BUN: 18 mg/dL (ref 6–20)
CO2: 23 mmol/L (ref 22–32)
Calcium: 9.1 mg/dL (ref 8.9–10.3)
Chloride: 105 mmol/L (ref 98–111)
Creatinine, Ser: 1.14 mg/dL (ref 0.61–1.24)
GFR calc Af Amer: >60 mL/min (ref >60)
GFR calc non Af Amer: >60 mL/min (ref >60)
Glucose, Bld: 86 mg/dL (ref 70–99)
Potassium: 3.7 mmol/L (ref 3.5–5.1)
Sodium: 141 mmol/L (ref 135–145)

## 2019-06-26 LAB — HIV ANTIBODY (ROUTINE TESTING W REFLEX): HIV Screen 4th Generation wRfx: NONREACTIVE

## 2019-06-26 LAB — TROPONIN I (HIGH SENSITIVITY)
Troponin I (High Sensitivity): 14 ng/L (ref <18)
Troponin I (High Sensitivity): 24 ng/L — ABNORMAL HIGH (ref <18)

## 2019-06-26 LAB — LIPID PANEL
Cholesterol: 145 mg/dL (ref 0–200)
HDL: 28 mg/dL — ABNORMAL LOW (ref >40)
LDL Cholesterol: 98 mg/dL (ref 0–99)
Total CHOL/HDL Ratio: 5.2 RATIO
Triglycerides: 94 mg/dL (ref <150)
VLDL: 19 mg/dL (ref 0–40)

## 2019-06-26 LAB — POCT ACTIVATED CLOTTING TIME
Activated Clotting Time: 263 seconds
Activated Clotting Time: 268 seconds

## 2019-06-26 SURGERY — LEFT HEART CATH AND CORONARY ANGIOGRAPHY
Anesthesia: LOCAL

## 2019-06-26 MED ORDER — MIDAZOLAM HCL 2 MG/2ML IJ SOLN
INTRAMUSCULAR | Status: AC
  Filled 2019-06-26: qty 2

## 2019-06-26 MED ORDER — ATORVASTATIN CALCIUM 80 MG PO TABS
80.0000 mg | ORAL_TABLET | ORAL | Status: AC
  Administered 2019-06-26: 16:00:00 80 mg via ORAL
  Filled 2019-06-26: qty 1

## 2019-06-26 MED ORDER — MIDAZOLAM HCL 2 MG/2ML IJ SOLN
INTRAMUSCULAR | Status: DC | PRN
  Administered 2019-06-26: 2 mg via INTRAVENOUS

## 2019-06-26 MED ORDER — NITROGLYCERIN 0.4 MG SL SUBL: 0.4000 mg | SUBLINGUAL_TABLET | SUBLINGUAL | Status: DC | PRN

## 2019-06-26 MED ORDER — HEPARIN (PORCINE) IN NACL 1000-0.9 UT/500ML-% IV SOLN
INTRAVENOUS | Status: DC | PRN
  Administered 2019-06-26 (×2): 500 mL

## 2019-06-26 MED ORDER — THE SENSUOUS HEART BOOK
Freq: Once | Status: AC
  Administered 2019-06-27: 02:00:00
  Filled 2019-06-26: qty 1

## 2019-06-26 MED ORDER — ONDANSETRON HCL 4 MG/2ML IJ SOLN
4.0000 mg | Freq: Once | INTRAMUSCULAR | Status: AC
  Administered 2019-06-26: 10:00:00 4 mg via INTRAVENOUS
  Filled 2019-06-26: qty 2

## 2019-06-26 MED ORDER — SODIUM CHLORIDE 0.9% FLUSH: 3.0000 mL | INTRAVENOUS | Status: DC | PRN

## 2019-06-26 MED ORDER — METOPROLOL TARTRATE 12.5 MG HALF TABLET
12.5000 mg | ORAL_TABLET | Freq: Two times a day (BID) | ORAL | Status: DC
  Administered 2019-06-26 – 2019-06-27 (×3): 12.5 mg via ORAL
  Filled 2019-06-26 (×3): qty 1

## 2019-06-26 MED ORDER — TICAGRELOR 90 MG PO TABS
90.0000 mg | ORAL_TABLET | Freq: Two times a day (BID) | ORAL | Status: DC
  Administered 2019-06-27 (×2): 90 mg via ORAL
  Filled 2019-06-26 (×2): qty 1

## 2019-06-26 MED ORDER — SODIUM CHLORIDE 0.9 % WEIGHT BASED INFUSION: 3.0000 mL/kg/h | INTRAVENOUS | Status: DC

## 2019-06-26 MED ORDER — ASPIRIN 81 MG PO CHEW
324.0000 mg | CHEWABLE_TABLET | Freq: Once | ORAL | Status: DC
  Filled 2019-06-26: qty 4

## 2019-06-26 MED ORDER — IOHEXOL 350 MG/ML SOLN
INTRAVENOUS | Status: DC | PRN
  Administered 2019-06-26: 135 mL

## 2019-06-26 MED ORDER — TICAGRELOR 90 MG PO TABS
ORAL_TABLET | ORAL | Status: DC | PRN
  Administered 2019-06-26: 180 mg via ORAL

## 2019-06-26 MED ORDER — SODIUM CHLORIDE 0.9 % WEIGHT BASED INFUSION
1.0000 mL/kg/h | INTRAVENOUS | Status: AC
  Administered 2019-06-26: 1 mL/kg/h via INTRAVENOUS

## 2019-06-26 MED ORDER — HEART ATTACK BOUNCING BOOK
Freq: Once | Status: AC
  Administered 2019-06-27: 02:00:00
  Filled 2019-06-26: qty 1

## 2019-06-26 MED ORDER — SODIUM CHLORIDE 0.9 % IV SOLN: 250.0000 mL | INTRAVENOUS | Status: DC | PRN

## 2019-06-26 MED ORDER — NITROGLYCERIN 1 MG/10 ML FOR IR/CATH LAB
INTRA_ARTERIAL | Status: AC
  Filled 2019-06-26: qty 10

## 2019-06-26 MED ORDER — ASPIRIN 300 MG RE SUPP: 300.0000 mg | RECTAL | Status: AC

## 2019-06-26 MED ORDER — ONDANSETRON HCL 4 MG/2ML IJ SOLN: 4.0000 mg | Freq: Four times a day (QID) | INTRAMUSCULAR | Status: DC | PRN

## 2019-06-26 MED ORDER — NITROGLYCERIN IN D5W 200-5 MCG/ML-% IV SOLN
0.0000 ug/min | INTRAVENOUS | Status: DC
  Administered 2019-06-26: 10:00:00 5 ug/min via INTRAVENOUS
  Filled 2019-06-26: qty 250

## 2019-06-26 MED ORDER — LIDOCAINE HCL (PF) 1 % IJ SOLN
INTRAMUSCULAR | Status: AC
  Filled 2019-06-26: qty 30

## 2019-06-26 MED ORDER — FENTANYL CITRATE (PF) 100 MCG/2ML IJ SOLN
INTRAMUSCULAR | Status: AC
  Filled 2019-06-26: qty 2

## 2019-06-26 MED ORDER — VERAPAMIL HCL 2.5 MG/ML IV SOLN
INTRAVENOUS | Status: AC
  Filled 2019-06-26: qty 2

## 2019-06-26 MED ORDER — ASPIRIN 81 MG PO CHEW
324.0000 mg | CHEWABLE_TABLET | ORAL | Status: AC
  Administered 2019-06-26: 324 mg via ORAL
  Filled 2019-06-26: qty 4

## 2019-06-26 MED ORDER — POTASSIUM CHLORIDE CRYS ER 20 MEQ PO TBCR: 40.0000 meq | EXTENDED_RELEASE_TABLET | Freq: Once | ORAL | Status: DC

## 2019-06-26 MED ORDER — HEPARIN BOLUS VIA INFUSION
4000.0000 [IU] | Freq: Once | INTRAVENOUS | Status: AC
  Administered 2019-06-26: 4000 [IU] via INTRAVENOUS
  Filled 2019-06-26: qty 4000

## 2019-06-26 MED ORDER — NITROGLYCERIN 0.4 MG SL SUBL
0.4000 mg | SUBLINGUAL_TABLET | SUBLINGUAL | Status: DC | PRN
  Administered 2019-06-27: 12:00:00 0.4 mg via SUBLINGUAL
  Filled 2019-06-26: qty 1

## 2019-06-26 MED ORDER — ACETAMINOPHEN 325 MG PO TABS
650.0000 mg | ORAL_TABLET | ORAL | Status: DC | PRN
  Administered 2019-06-26 – 2019-06-27 (×3): 650 mg via ORAL
  Filled 2019-06-26 (×3): qty 2

## 2019-06-26 MED ORDER — FENTANYL CITRATE (PF) 100 MCG/2ML IJ SOLN
INTRAMUSCULAR | Status: DC | PRN
  Administered 2019-06-26 (×2): 50 ug via INTRAVENOUS

## 2019-06-26 MED ORDER — HEPARIN (PORCINE) IN NACL 1000-0.9 UT/500ML-% IV SOLN
INTRAVENOUS | Status: AC
  Filled 2019-06-26: qty 1000

## 2019-06-26 MED ORDER — NITROGLYCERIN 1 MG/10 ML FOR IR/CATH LAB
INTRA_ARTERIAL | Status: DC | PRN
  Administered 2019-06-26 (×4): 200 ug via INTRACORONARY

## 2019-06-26 MED ORDER — ASPIRIN 81 MG PO CHEW
81.0000 mg | CHEWABLE_TABLET | ORAL | Status: DC
  Filled 2019-06-26: qty 1

## 2019-06-26 MED ORDER — HEPARIN SODIUM (PORCINE) 1000 UNIT/ML IJ SOLN
INTRAMUSCULAR | Status: DC | PRN
  Administered 2019-06-26: 3000 [IU] via INTRAVENOUS
  Administered 2019-06-26: 8000 [IU] via INTRAVENOUS
  Administered 2019-06-26: 2000 [IU] via INTRAVENOUS

## 2019-06-26 MED ORDER — HEPARIN (PORCINE) 25000 UT/250ML-% IV SOLN
1100.0000 [IU]/h | INTRAVENOUS | Status: DC
  Administered 2019-06-26: 1100 [IU]/h via INTRAVENOUS
  Filled 2019-06-26: qty 250

## 2019-06-26 MED ORDER — HEPARIN SODIUM (PORCINE) 1000 UNIT/ML IJ SOLN
INTRAMUSCULAR | Status: AC
  Filled 2019-06-26: qty 1

## 2019-06-26 MED ORDER — SODIUM CHLORIDE 0.9% FLUSH
3.0000 mL | Freq: Two times a day (BID) | INTRAVENOUS | Status: DC
  Administered 2019-06-27 (×2): 3 mL via INTRAVENOUS

## 2019-06-26 MED ORDER — VERAPAMIL HCL 2.5 MG/ML IV SOLN
INTRAVENOUS | Status: DC | PRN
  Administered 2019-06-26: 14:00:00 5 mL via INTRA_ARTERIAL

## 2019-06-26 MED ORDER — ANGIOPLASTY BOOK
Freq: Once | Status: AC
  Administered 2019-06-27: 02:00:00
  Filled 2019-06-26: qty 1

## 2019-06-26 MED ORDER — LIDOCAINE HCL (PF) 1 % IJ SOLN
INTRAMUSCULAR | Status: DC | PRN
  Administered 2019-06-26: 2 mL

## 2019-06-26 MED ORDER — ATORVASTATIN CALCIUM 40 MG PO TABS: 40.0000 mg | ORAL_TABLET | Freq: Every day | ORAL | Status: DC

## 2019-06-26 MED ORDER — ASPIRIN EC 81 MG PO TBEC
81.0000 mg | DELAYED_RELEASE_TABLET | Freq: Every day | ORAL | Status: DC
  Administered 2019-06-27: 08:00:00 81 mg via ORAL
  Filled 2019-06-26: qty 1

## 2019-06-26 MED ORDER — LABETALOL HCL 5 MG/ML IV SOLN: 10.0000 mg | INTRAVENOUS | Status: AC | PRN

## 2019-06-26 MED ORDER — BUPROPION HCL ER (XL) 150 MG PO TB24
150.0000 mg | ORAL_TABLET | Freq: Every day | ORAL | Status: DC
  Administered 2019-06-26 – 2019-06-27 (×2): 150 mg via ORAL
  Filled 2019-06-26 (×2): qty 1

## 2019-06-26 MED ORDER — SODIUM CHLORIDE 0.9 % WEIGHT BASED INFUSION
1.0000 mL/kg/h | INTRAVENOUS | Status: DC
  Administered 2019-06-26: 1 mL/kg/h via INTRAVENOUS

## 2019-06-26 SURGICAL SUPPLY — 20 items
BALLN SAPPHIRE 2.5X20 (BALLOONS) ×2
BALLN SAPPHIRE ~~LOC~~ 3.25X15 (BALLOONS) ×1 IMPLANT
BALLN ~~LOC~~ EMERGE MR 2.5X12 (BALLOONS) ×2
BALLOON SAPPHIRE 2.5X20 (BALLOONS) IMPLANT
BALLOON ~~LOC~~ EMERGE MR 2.5X12 (BALLOONS) IMPLANT
CATH OPTITORQUE TIG 4.0 5F (CATHETERS) ×1 IMPLANT
CATH VISTA GUIDE 6FR XB3.5 (CATHETERS) ×1 IMPLANT
DEVICE RAD COMP TR BAND LRG (VASCULAR PRODUCTS) ×1 IMPLANT
ELECT DEFIB PAD ADLT CADENCE (PAD) ×1 IMPLANT
GLIDESHEATH SLEND A-KIT 6F 22G (SHEATH) ×1 IMPLANT
GUIDEWIRE INQWIRE 1.5J.035X260 (WIRE) IMPLANT
INQWIRE 1.5J .035X260CM (WIRE) ×2
KIT ENCORE 26 ADVANTAGE (KITS) ×1 IMPLANT
KIT HEART LEFT (KITS) ×2 IMPLANT
PACK CARDIAC CATHETERIZATION (CUSTOM PROCEDURE TRAY) ×2 IMPLANT
STENT SYNERGY DES 3X28 (Permanent Stent) ×1 IMPLANT
TRANSDUCER W/STOPCOCK (MISCELLANEOUS) ×2 IMPLANT
TUBING CIL FLEX 10 FLL-RA (TUBING) ×2 IMPLANT
WIRE COUGAR XT STRL 190CM (WIRE) ×1 IMPLANT
WIRE RUNTHROUGH .014X180CM (WIRE) ×1 IMPLANT

## 2019-06-26 NOTE — Progress Notes (Signed)
ANTICOAGULATION CONSULT NOTE - Initial Consult  Pharmacy Consult for heparin Indication: chest pain/ACS   Patient Measurements: Height: 6' 3.5" (191.8 cm) Weight: 195 lb (88.5 kg) IBW/kg (Calculated) : 85.65   Vital Signs: Temp: 97.9 F (36.6 C) (10/06 0603) Temp Source: Oral (10/06 0603) BP: 159/106 (10/06 0603) Pulse Rate: 68 (10/06 0603)  Labs: Recent Labs    06/25/19 2134 06/25/19 2354  HGB 14.5  --   HCT 43.3  --   PLT 249  --   CREATININE 1.27*  --   TROPONINIHS 26* 24*    Estimated Creatinine Clearance: 83.4 mL/min (A) (by C-G formula based on SCr of 1.27 mg/dL (H)).   Medical History: Past Medical History:  Diagnosis Date  . Chest tightness    troponin elevation 0.4. cath [facility] 03/03/10 - normal  . Depression   . GERD (gastroesophageal reflux disease)   . History of cardiac catheterization    EF 60% 6/11  . Hypertriglyceridemia      Assessment: 53 yo male admitted with chest pain with increasing frequency over the last month. Planning to start heparin for ACS while working up the patient. CBC wnl, SCr 1.27, baseline appears to be ~1.  Goal of Therapy:  Heparin level 0.3-0.7 units/ml Monitor platelets by anticoagulation protocol: Yes   Plan:  -Heparin bolus 4000 units x1 then 1100 units/hr -Check level this afternoon -Daily HL, CBC   Knight Oelkers, Jake Church 06/26/2019,9:43 AM

## 2019-06-26 NOTE — Progress Notes (Signed)
  Echocardiogram 2D Echocardiogram has been performed.  Jon Shaw 06/26/2019, 4:46 PM

## 2019-06-26 NOTE — Interval H&P Note (Signed)
History and Physical Interval Note:  06/26/2019 1:39 PM  Jon Shaw  has presented today for surgery, with the diagnosis of Unstable Angina.  The various methods of treatment have been discussed with the patient and family. After consideration of risks, benefits and other options for treatment, the patient has consented to  Procedure(s): LEFT HEART CATH AND CORONARY ANGIOGRAPHY (N/A) as a surgical intervention.  The patient's history has been reviewed, patient examined, no change in status, stable for surgery.  I have reviewed the patient's chart and labs.  Questions were answered to the patient's satisfaction.    Cath Lab Visit (complete for each Cath Lab visit)  Clinical Evaluation Leading to the Procedure:   ACS: Yes.    Non-ACS:    Anginal Classification: CCS IV  Anti-ischemic medical therapy: No Therapy  Non-Invasive Test Results: No non-invasive testing performed  Prior CABG: No previous CABG       Adrian Prows

## 2019-06-26 NOTE — ED Provider Notes (Signed)
[facility]GENCY [facility]MENT Provider Note   CSN: [id number] Arrival date & time: 06/25/19  2118     History   Chief Complaint Chief Complaint  Patient presents with  . Chest Pain    HPI Suszanne ConnersRodney Stetson is a 53 y.o. male.  HPI: A 53 year old patient with a history of hypertension and hypercholesterolemia presents for evaluation of chest pain. Initial onset of pain was more than 6 hours ago. The patient's chest pain is well-localized, is described as heaviness/pressure/tightness and is worse with exertion. The patient reports some diaphoresis. The patient's chest pain is middle- or left-sided, is not sharp and does radiate to the arms/jaw/neck. The patient does not complain of nausea. The patient has a family history of coronary artery disease in a first-degree relative with onset less than age 51. The patient has no history of stroke, has no history of peripheral artery disease, has not smoked in the past 90 days, denies any history of treated diabetes and does not have an elevated BMI (>=30).   HPI   Suszanne ConnersRodney Demers is a 53 y.o. male, with a history of cardiac cath, GERD, hypertriglyceridemia, presenting to the ED with chest pain intermittent for the past month.  Pain is left chest, described as a pressure/tightness, radiating to the left arm, 6/10, brought on with exertion.  Patient states the pain will resolve with rest, but will last about an hour prior to resolution.  Episodes of chest pain have been increasing in frequency over the past month.  Accompanied by shortness of breath, lightheadedness, as well as increasing fatigue. Last night, around 7 PM he began to have chest pain following exertion, however, pain did not improve with rest and has persisted.  Pain is currently 1/10.  Patient had previous cardiac cath performed 2011, however, states there were no noted abnormalities and no stents needed to be placed.  He was prescribed daily aspirin at that time,  however, only started taking it again when he started having the chest pain a month ago. Denies history of PE/DVT, shortness of breath at rest, recent immobilization, recent surgery, recent trauma. Denies fever/chills, cough, syncope, vomiting, diarrhea, abdominal pain, neuro deficits, lower extremity edema/pain, or any other complaints.    Past Medical History:  Diagnosis Date  . Chest tightness    [facility]. [facility] normal  . Depression   . GERD (gastroesophageal reflux disease)   . History of cardiac catheterization    EF 60% 6/11  . Hypertriglyceridemia     Patient Active Problem List   Diagnosis Date Noted  . Unstable angina (HCC) 06/26/2019  . MDD (major depressive disorder), severe (HCC) 05/13/2016  . DYSLIPIDEMIA 03/09/2010  . GERD 03/09/2010  . CHEST PAIN 03/09/2010    Past Surgical History:  Procedure Laterality Date  . CARDIAC CATHETERIZATION          Home Medications    Prior to Admission medications   Medication Sig Start Date End Date Taking? Authorizing Provider  aspirin 325 MG tablet Take 325 mg by mouth daily as needed for mild pain.   Yes [provider]  aspirin EC 81 MG tablet Take 81 mg by mouth at bedtime.   Yes [provider]  buPROPion (WELLBUTRIN XL) 150 MG 24 hr tablet Take 1 tablet (150 mg total) by mouth daily. Patient taking differently: Take 150 mg by mouth at bedtime.  05/18/16  Yes Adonis BrookAgustin, Sheila, NP  omeprazole (PRILOSEC) 20 MG capsule Take 40 mg by  mouth at bedtime.   Yes [provider]    Family History Family History  Problem Relation Age of Onset  . Heart attack Father 55    Social History Social History   Tobacco Use  . Smoking status: Never Smoker  . Smokeless tobacco: Never Used  Substance Use Topics  . Alcohol use: No  . Drug use: No     Allergies   Sulfonamide derivatives   Review of Systems Review of Systems  Constitutional: Positive for fatigue.  Negative for chills and fever.  Respiratory: Negative for cough and shortness of breath.   Cardiovascular: Positive for chest pain. Negative for leg swelling.  Gastrointestinal: Negative for abdominal pain, diarrhea, nausea and vomiting.  Musculoskeletal: Negative for back pain.  Neurological: Positive for light-headedness. Negative for dizziness, syncope, weakness and numbness.  All other systems reviewed and are negative.    Physical Exam Updated Vital Signs BP (!) 159/106 (BP Location: Right Arm)   Pulse 68   Temp 97.9 F (36.6 C) (Oral)   Resp 18   Ht 6' 3.5" (1.918 m)   Wt 88.5 kg   SpO2 99%   BMI 24.05 kg/m   Physical Exam Vitals signs and nursing note reviewed.  Constitutional:      General: He is not in acute distress.    Appearance: He is well-developed. He is not diaphoretic.  HENT:     Head: Normocephalic and atraumatic.     Mouth/Throat:     Mouth: Mucous membranes are moist.     Pharynx: Oropharynx is clear.  Eyes:     Conjunctiva/sclera: Conjunctivae normal.  Neck:     Musculoskeletal: Neck supple.  Cardiovascular:     Rate and Rhythm: Normal rate and regular rhythm.     Pulses: Normal pulses.          Radial pulses are 2+ on the right side and 2+ on the left side.       Posterior tibial pulses are 2+ on the right side and 2+ on the left side.     Heart sounds: Normal heart sounds.     Comments: Tactile temperature in the extremities appropriate and equal bilaterally. Pulmonary:     Effort: Pulmonary effort is normal. No respiratory distress.     Breath sounds: Normal breath sounds.  Abdominal:     Palpations: Abdomen is soft.     Tenderness: There is no abdominal tenderness. There is no guarding.  Musculoskeletal:     Right lower leg: No edema.     Left lower leg: No edema.  Lymphadenopathy:     Cervical: No cervical adenopathy.  Skin:    General: Skin is warm and dry.  Neurological:     Mental Status: He is alert.     Comments: Sensation  grossly intact to light touch in the extremities.  Grip strengths equal bilaterally.  Strength 5/5 in all extremities. Coordination intact. Cranial nerves III-XII grossly intact. No facial droop.   Psychiatric:        Mood and Affect: Mood and affect normal.        Speech: Speech normal.        Behavior: Behavior normal.      ED Treatments / Results  Labs (all labs ordered are listed, but only abnormal results are displayed) Labs Reviewed  BASIC METABOLIC PANEL - Abnormal; Notable for the following components:      Result Value   Potassium 3.2 (*)    Creatinine, Ser 1.27 (*)  All other components within normal limits  LIPID PANEL - Abnormal; Notable for the following components:   HDL 28 (*)    All other components within normal limits  TROPONIN I (HIGH SENSITIVITY) - Abnormal; Notable for the following components:   Troponin I (High Sensitivity) 26 (*)    All other components within normal limits  TROPONIN I (HIGH SENSITIVITY) - Abnormal; Notable for the following components:   Troponin I (High Sensitivity) 24 (*)    All other components within normal limits  SARS CORONAVIRUS 2 (HOSPITAL ORDER, [facility])  CBC  BASIC METABOLIC PANEL  HEPARIN LEVEL (UNFRACTIONATED)  HIV ANTIBODY (ROUTINE TESTING W REFLEX)  HIV4GL SAVE TUBE  TROPONIN I (HIGH SENSITIVITY)    EKG EKG Interpretation  Date/Time:  Tuesday June 26 2019 07:28:51 EDT Ventricular Rate:  70 PR Interval:  168 QRS Duration: 88 QT Interval:  430 QTC Calculation: 464 R Axis:   80 Text Interpretation:  Normal sinus rhythm Nonspecific T wave abnormality Prolonged QT Abnormal ECG new t wave inversions anterior from prior 6/11 Confirmed by Aletta Edouard [phone]) on 06/26/2019 7:51:30 AM   Radiology Dg Chest 2 View  Result Date: 06/25/2019 CLINICAL DATA:  Chest pain EXAM: CHEST - 2 VIEW COMPARISON:  02/13/2016 FINDINGS: The heart size and mediastinal contours are within normal limits.  Both lungs are clear. The visualized skeletal structures are unremarkable. IMPRESSION: No active cardiopulmonary disease. Electronically Signed   By: Ulyses Jarred M.D.   On: 06/25/2019 22:01    Procedures .Critical Care Performed by: Lorayne Bender, PA-C Authorized by: Lorayne Bender, PA-C   Critical care provider statement:    Critical care time (minutes):  35   Critical care time was exclusive of:  Separately billable procedures and treating other patients   Critical care was necessary to treat or prevent imminent or life-threatening deterioration of the following conditions:  Circulatory failure   Critical care was time spent personally by me on the following activities:  Development of treatment plan with patient or surrogate, discussions with consultants, examination of patient, obtaining history from patient or surrogate, ordering and performing treatments and interventions, ordering and review of laboratory studies, ordering and review of radiographic studies, pulse oximetry and re-evaluation of patient's condition   I assumed direction of critical care for this patient from another provider in my specialty: no     (including critical care time)  Medications Ordered in ED Medications  sodium chloride flush (NS) 0.9 % injection 3 mL (has no administration in time range)  nitroGLYCERIN 50 mg in dextrose 5 % 250 mL (0.2 mg/mL) infusion (5 mcg/min Intravenous New Bag/Given 06/26/19 1008)  aspirin chewable tablet 81 mg (has no administration in time range)  0.9% sodium chloride infusion (has no administration in time range)    Followed by  0.9% sodium chloride infusion (1 mL/kg/hr  88.5 kg Intravenous New Bag/Given 06/26/19 1146)  heparin ADULT infusion 100 units/mL (25000 units/275mL sodium chloride 0.45%) (1,100 Units/hr Intravenous New Bag/Given 06/26/19 1015)  aspirin chewable tablet 324 mg (has no administration in time range)    Or  aspirin suppository 300 mg (has no administration in  time range)  aspirin EC tablet 81 mg (has no administration in time range)  nitroGLYCERIN (NITROSTAT) SL tablet 0.4 mg (has no administration in time range)  acetaminophen (TYLENOL) tablet 650 mg (has no administration in time range)  ondansetron (ZOFRAN) injection 4 mg (has no administration in time range)  metoprolol tartrate (LOPRESSOR)  tablet 12.5 mg (has no administration in time range)  atorvastatin (LIPITOR) tablet 40 mg (has no administration in time range)  ondansetron (ZOFRAN) injection 4 mg (4 mg Intravenous Given 06/26/19 1006)  heparin bolus via infusion 4,000 Units (4,000 Units Intravenous Bolus from Bag 06/26/19 1018)     Initial Impression / Assessment and Plan / ED Course  I have reviewed the triage vital signs and the nursing notes.  Pertinent labs & imaging results that were available during my care of the patient were reviewed by me and considered in my medical decision making (see chart for details).  Clinical Course as of Jun 25 1149  Tue Jun 26, 2019  1610 Patient denies known history of hypertension.  BP(!): 159/106 [SJ]  9604 Spoke with Dennie Bible, nurse answering for Dr. Jacinto Halim, cardiologist on call for unassigned patients. We discussed patient's symptoms, EKG, and current lab findings.  He will pass information on to Dr. Jacinto Halim, who is currently in a procedure.   [SJ]  [id number] Spoke with Dr. Jacinto Halim, cardiologist. He plans to take the patient directly to the Cath Lab.  Requests heparin drip, nitro drip, and lipid panel.  No need for ASA at this time. Keep patient n.p.o.   [SJ]  [id number] 53 year old male here with what sounds like accelerating anginal symptoms over the past month.  He says that started with exertion and now takes minimal effort to experience chest tightness.  EKG shows new T wave inversions anteriorly.  Troponins mildly elevated although not rising.  Discussed with cardiology who will evaluate the patient for possible catheterization.  Heparin and nitro.   [MB]   [phone] Pharmacist is working on putting in heparin order.   [SJ]    Clinical Course User Index [MB] Terrilee Files, MD [SJ] Anselm Pancoast, [place]    HEAR Score: 6  Patient presents with chest discomfort accompanied by shortness of breath, lightheadedness, and increasing fatigue.  Symptoms are exertional and originally resolved with rest, however, he has had a low level amount of chest discomfort since last night. Troponins from last night mildly elevated.  Nonspecific T wave abnormalities noted on EKG, new from previous. Suspect stable angina that is now unstable angina.  Patient hypertensive with no previous hypertension diagnosis. Dissection was considered, but thought less likely base on: History and description of the pain are not suggestive, patient is not ill-appearing, lack of risk factors, equal bilateral pulses, lack of neurologic deficits, no widened mediastinum on chest x-ray.  Additionally noted are patient's mildly elevated creatinine and mild hypokalemia.  Mild hypokalemia can be addressed later, likely with oral supplementation.  Findings and plan of care discussed with Erasmo Score, MD. Dr. Charm Barges personally evaluated and examined this patient.  Vitals:   06/25/19 2126 06/25/19 2127 06/26/19 0333 06/26/19 0603  BP: (!) 174/109  (!) 156/103 (!) 159/106  Pulse: 80  62 68  Resp: Temp: 98.6 F (37 C)  98.3 F (36.8 C) 97.9 F (36.6 C)  TempSrc: Oral  Oral Oral  SpO2: 97%  97% 99%  Weight:  88.5 kg    Height:  6' 3.5" (1.918 m)     Vitals:   06/26/19 0945 06/26/19 1000 06/26/19 1015 06/26/19 1030  BP:  (!) 132/92 133/89 126/84  Pulse: (!) 58 64 62 (!) 56  Resp: Temp:      TempSrc:      SpO2: 98% 97% 97% 97%  Weight:  Height:         Final Clinical Impressions(s) / ED Diagnoses   Final diagnoses:  Unstable angina [facility])    ED Discharge Orders    None       Concepcion Living 06/26/19 1151    Terrilee Files, MD  06/26/19 [phone]

## 2019-06-26 NOTE — Progress Notes (Signed)
TR BAND REMOVAL  LOCATION:    right radial  DEFLATED PER PROTOCOL:    Yes.    TIME BAND OFF / DRESSING APPLIED:    2045   SITE UPON ARRIVAL:    Level 0  SITE AFTER BAND REMOVAL:    Level 0  CIRCULATION SENSATION AND MOVEMENT:    Within Normal Limits   Yes.    COMMENTS:   Post TR band instructions given, sterile dressing applied, good capillary refill. 

## 2019-06-26 NOTE — H&P (Signed)
Jon Shaw is an 53 y.o. male.   Chief Complaint: Chest pain HPI:   53 year old Caucasian male with no significant medical comorbidities, admitted with chest pain.  Patient has been having worsening exertional chest pain for last 1 month.  This is brought upon by physical activity such as walking, and relieved with rest.  Last time, patient was working in his backyard, trying to build a fire pit, is when he started chest pain that persisted for several minutes.  Patient's [facility] drove [facility] to [facility] department.  Chest pain finally eased off after several hours while in the ED with IV nitroglycerin.  His troponin is minimally elevated.  Patient is currently chest pain-free on IV nitroglycerin of 5 mcg/min.  Patient had coronary angiography in 2011 with an episode of non-STEMI, that revealed no significant coronary artery disease.  Patient is a non-smoker, nondiabetic.  His father had coronary artery bypass graft surgery in his 3350s.  Past Medical History:  Diagnosis Date  . Chest tightness    troponin elevation 0.4. [facility] - normal  . Depression   . GERD (gastroesophageal reflux disease)   . History of cardiac catheterization    EF 60% 6/11  . Hypertriglyceridemia     Past Surgical History:  Procedure Laterality Date  . CARDIAC CATHETERIZATION      Family History  Problem Relation Age of Onset  . Heart attack Father 4846   Social History:  reports that he has never smoked. He has never used smokeless tobacco. He reports that he does not drink alcohol or use drugs.  Allergies:  Allergies  Allergen Reactions  . Sulfonamide Derivatives     REACTION: disoriented    ROS   Blood pressure 126/84, pulse (!) 56, temperature 97.9 F (36.6 C), temperature source Oral, resp. rate 13, height 6' 3.5" (1.918 m), weight 88.5 kg, SpO2 97 %. Body mass index is 24.05 kg/m.  Physical Exam  Results for orders placed or performed during the hospital encounter of  06/25/19 (from the past 48 hour(s))  Basic metabolic panel     Status: Abnormal   Collection Time: 06/25/19  9:34 PM  Result Value Ref Range   Sodium 141 135 - 145 mmol/L   Potassium 3.2 (L) 3.5 - 5.1 mmol/L   Chloride 105 98 - 111 mmol/L   CO2 26 22 - 32 mmol/L   Glucose, Bld 92 70 - 99 mg/dL   BUN 16 6 - 20 mg/dL   Creatinine, Ser 1.611.27 (H) 0.61 - 1.24 mg/dL   Calcium 9.1 8.9 - 09.610.3 mg/dL   GFR calc non Af Amer >60 >60 mL/min   GFR calc Af Amer >60 >60 mL/min   Anion gap 10 5 - 15    Comment: Performed at [facility], [address] [place], [place] [id number]  CBC     Status: None   Collection Time: 06/25/19  9:34 PM  Result Value Ref Range   WBC 6.9 4.0 - 10.5 K/uL   RBC 4.90 4.22 - 5.81 MIL/uL   Hemoglobin 14.5 13.0 - 17.0 g/dL   HCT 98.143.3 19.139.0 - 47.852.0 %   MCV 88.4 80.0 - 100.0 fL   MCH 29.6 26.0 - 34.0 pg   MCHC 33.5 30.0 - 36.0 g/dL   RDW 29.512.5 62.111.5 - 30.815.5 %   Platelets 249 150 - 400 K/uL   nRBC 0.0 0.0 - 0.2 %    Comment: Performed at [facility], [address], [place], [place] [address]  Troponin I (High Sensitivity)     Status: Abnormal   Collection Time: 06/25/19  9:34 PM  Result Value Ref Range   Troponin I (High Sensitivity) 26 (H) <18 ng/L    Comment: (NOTE) Elevated high sensitivity troponin I (hsTnI) values and significant  changes across serial measurements may suggest ACS but many other  chronic and acute conditions are known to elevate hsTnI results.  Refer to the "Links" section for chest pain algorithms and additional  guidance. Performed at [facility], [address], [place], [place] [address]   Troponin I (High Sensitivity)     Status: Abnormal   Collection Time: 06/25/19 11:54 PM  Result Value Ref Range   Troponin I (High Sensitivity) 24 (H) <18 ng/L    Comment: (NOTE) Elevated high sensitivity troponin I (hsTnI) values and significant  changes across serial measurements may suggest ACS but many other  chronic and acute  conditions are known to elevate hsTnI results.  Refer to the "Links" section for chest pain algorithms and additional  guidance. Performed at [facility], [address] [place], [place] [address]   Troponin I (High Sensitivity)     Status: None   Collection Time: 06/26/19  8:43 AM  Result Value Ref Range   Troponin I (High Sensitivity) 14 <18 ng/L    Comment: (NOTE) Elevated high sensitivity troponin I (hsTnI) values and significant  changes across serial measurements may suggest ACS but many other  chronic and acute conditions are known to elevate hsTnI results.  Refer to the "Links" section for chest pain algorithms and additional  guidance. Performed at [facility], [address] [place], [place] [address]   Lipid panel     Status: Abnormal   Collection Time: 06/26/19  8:43 AM  Result Value Ref Range   Cholesterol 145 0 - 200 mg/dL   Triglycerides 94 <295 mg/dL   HDL 28 (L) >18 mg/dL   Total CHOL/HDL Ratio 5.2 RATIO   VLDL 19 0 - 40 mg/dL   LDL Cholesterol 98 0 - 99 mg/dL    Comment:        Total Cholesterol/HDL:CHD Risk Coronary Heart Disease Risk Table                     Men   Women  1/2 Average Risk   3.4   3.3  Average Risk       5.0   4.4  2 X Average Risk   9.6   7.1  3 X Average Risk  23.4   11.0        Use the calculated Patient Ratio above and the CHD Risk Table to determine the patient's CHD Risk.        ATP III CLASSIFICATION (LDL):  <100     mg/dL   Optimal  841-660  mg/dL   Near or Above                    Optimal  130-159  mg/dL   Borderline  630-160  mg/dL   High  >109     mg/dL   Very High Performed at [facility], [address] [place], [place] [address]     Labs:   Lab Results  Component Value Date   WBC 6.9 06/25/2019   HGB 14.5 06/25/2019   HCT 43.3 06/25/2019   MCV 88.4 06/25/2019   PLT  249 06/25/2019    Recent Labs  Lab 06/25/19 2134  NA 141  K 3.2*  CL 105  CO2 26  BUN 16  CREATININE 1.27*   CALCIUM 9.1  GLUCOSE 92    Lipid Panel     Component Value Date/Time   CHOL 145 06/26/2019 0843   TRIG 94 06/26/2019 0843   HDL 28 (L) 06/26/2019 0843   CHOLHDL 5.2 06/26/2019 0843   VLDL 19 06/26/2019 0843   LDLCALC 98 06/26/2019 0843    BNP (last 3 results) No results for input(s): BNP in the last 8760 hours.  HEMOGLOBIN A1C Lab Results  Component Value Date   HGBA1C (H) 03/03/2010    5.7 (NOTE)                                                                       According to the ADA Clinical Practice Recommendations for 2011, when HbA1c is used as a screening test:   >=6.5%   Diagnostic of Diabetes Mellitus           (if abnormal result  is confirmed)  5.7-6.4%   Increased risk of developing Diabetes Mellitus  References:Diagnosis and Classification of Diabetes Mellitus,Diabetes Care,2011,34(Suppl 1):S62-S69 and Standards of Medical Care in         Diabetes - 2011,Diabetes Care,2011,34  (Suppl 1):S11-S61.   MPG 117 (H) 03/03/2010    Cardiac Panel (last 3 results) No results for input(s): CKTOTAL, CKMB, RELINDX in the last 8760 hours.  Invalid input(s): TROPONINHS  Lab Results  Component Value Date   CKTOTAL 425 (H) 03/03/2010   CKMB (HH) 03/03/2010    25.1 CRITICAL VALUE NOTED.  VALUE IS CONSISTENT WITH PREVIOUSLY REPORTED AND CALLED VALUE.     TSH No results for input(s): TSH in the last 8760 hours.   (Not in a hospital admission)     Current Facility-Administered Medications:  .  [EXPIRED] 0.9% sodium chloride infusion, 3 mL/kg/hr, Intravenous, Continuous **FOLLOWED BY** 0.9% sodium chloride infusion, 1 mL/kg/hr, Intravenous, Continuous, Yates Decamp, MD .  aspirin chewable tablet 81 mg, 81 mg, Oral, Pre-Cath, Yates Decamp, MD .  heparin ADULT infusion 100 units/mL (25000 units/257mL sodium chloride 0.45%), 1,100 Units/hr, Intravenous, Continuous, Masters, Darl Householder, [facility], Last Rate: 11 mL/hr at 06/26/19 1015, 1,100 Units/hr at 06/26/19 1015 .  nitroGLYCERIN 50  mg in dextrose 5 % 250 mL (0.2 mg/mL) infusion, 0-200 mcg/min, Intravenous, Continuous, Joy, Shawn C, PA-C, Last Rate: 1.5 mL/hr at 06/26/19 1008, 5 mcg/min at 06/26/19 1008 .  sodium chloride flush (NS) 0.9 % injection 3 mL, 3 mL, Intravenous, Once, Terrilee Files, MD  Current Outpatient Medications:  .  aspirin 325 MG tablet, Take 325 mg by mouth daily as needed for mild pain., Disp: , Rfl:  .  aspirin EC 81 MG tablet, Take 81 mg by mouth at bedtime., Disp: , Rfl:  .  buPROPion (WELLBUTRIN XL) 150 MG 24 hr tablet, Take 1 tablet (150 mg total) by mouth daily. (Patient taking differently: Take 150 mg by mouth at bedtime. ), Disp: 30 tablet, Rfl: 0 .  omeprazole (PRILOSEC) 20 MG capsule, Take 40 mg by mouth at bedtime., Disp: , Rfl:    Today's Vitals   06/26/19 0945 06/26/19 1000 06/26/19  1015 06/26/19 1030  BP:  (!) 132/92 133/89 126/84  Pulse: (!) 58 64 62 (!) 56  Resp: 11 14 12 13   Temp:      TempSrc:      SpO2: 98% 97% 97% 97%  Weight:      Height:      PainSc:       Body mass index is 24.05 kg/m.   CARDIAC STUDIES:  EKG 06/26/2019: Sinus rhythm 70 bpm. Nonspecific T wave inversion anteroseptal leads. :  Echocardiogram pending:   Assessment/Plan  53 year old Caucasian male with no significant medical comorbidities, admitted with unstable angina.  Unstable angina: Currently chest pain-free on IV nitroglycerin 5 mcg/min.  High-sensitivity troponin minimally elevated 24. Continue IV heparin.  Added metoprolol 12.5 mg twice daily and Lipitor 40 mg daily. We will proceed with coronary angiography and possible intervention later today.  Nigel Mormon, MD 06/26/2019, 10:48 AM

## 2019-06-27 ENCOUNTER — Encounter (HOSPITAL_COMMUNITY): Payer: Self-pay | Admitting: Cardiology

## 2019-06-27 ENCOUNTER — Other Ambulatory Visit: Payer: Self-pay

## 2019-06-27 DIAGNOSIS — E7800 Pure hypercholesterolemia, unspecified: Secondary | ICD-10-CM

## 2019-06-27 LAB — ECHOCARDIOGRAM COMPLETE
Height: 75.5 in
Weight: 3120 oz

## 2019-06-27 LAB — CBC
HCT: 39.3 % (ref 39.0–52.0)
Hemoglobin: 13.6 g/dL (ref 13.0–17.0)
MCH: 30.3 pg (ref 26.0–34.0)
MCHC: 34.6 g/dL (ref 30.0–36.0)
MCV: 87.5 fL (ref 80.0–100.0)
Platelets: 223 10*3/uL (ref 150–400)
RBC: 4.49 MIL/uL (ref 4.22–5.81)
RDW: 12.7 % (ref 11.5–15.5)
WBC: 7.2 10*3/uL (ref 4.0–10.5)
nRBC: 0 % (ref 0.0–0.2)

## 2019-06-27 MED ORDER — NITROGLYCERIN 0.4 MG SL SUBL: 0.4000 mg | SUBLINGUAL_TABLET | SUBLINGUAL | 3 refills | Status: AC | PRN

## 2019-06-27 MED ORDER — METOPROLOL TARTRATE 25 MG PO TABS: 25.0000 mg | ORAL_TABLET | Freq: Two times a day (BID) | ORAL | 1 refills | Status: DC

## 2019-06-27 MED ORDER — ATORVASTATIN CALCIUM 80 MG PO TABS
80.0000 mg | ORAL_TABLET | Freq: Every day | ORAL | Status: DC
  Administered 2019-06-27: 80 mg via ORAL
  Filled 2019-06-27: qty 1

## 2019-06-27 MED ORDER — ATORVASTATIN CALCIUM 80 MG PO TABS: 80.0000 mg | ORAL_TABLET | Freq: Every day | ORAL | 2 refills | Status: DC

## 2019-06-27 MED ORDER — TICAGRELOR 90 MG PO TABS: 90.0000 mg | ORAL_TABLET | Freq: Two times a day (BID) | ORAL | 0 refills | Status: DC

## 2019-06-27 MED ORDER — INFLUENZA VAC SPLIT QUAD 0.5 ML IM SUSY
0.5000 mL | PREFILLED_SYRINGE | Freq: Once | INTRAMUSCULAR | Status: AC
  Administered 2019-06-27: 0.5 mL via INTRAMUSCULAR
  Filled 2019-06-27: qty 0.5

## 2019-06-27 NOTE — Progress Notes (Signed)
Pt stated he was having chest pain 3/10. BP 140/94, HR 63. RN called [place], MD office. Spoke with Marcene Brawn and stated she would make Dr Virgina Jock aware of patient's status. EKG performed and 1 sublingual nitro given. Will continue to monitor.

## 2019-06-27 NOTE — Discharge Summary (Signed)
Physician Discharge Summary  Patient ID: Leverett Camplin MRN: [id number] DOB/AGE: 1966-06-15 53 y.o.  Admit date: 06/25/2019 Discharge date: 06/27/2019  Primary Discharge Diagnosis NSTEMI involving the LAD Mild hyperlipidemia Coronary artery disease of the native vessel with unstable angina pectoris  Significant Diagnostic Studies: Coronary angiogram 06/26/2019: Normal LVEF at 65 to 70% with mild intraventricular pressure gradient at the apex, no pressure gradient across the aortic valve.  Normal EDP. Mild disease in RCA and circumflex.  LAD is diffusely diseased, mid segment has 99% stenosis, D1 has 40% stenosis.  Successful stenting of mid LAD with implantation of 3.0 x 28 mm Synergy DES, postdilated with 3.25 x 15 mm [place] at 20 atmospheric pressure and distal and postdilated with 2.5 x 12 mm emerge [place] at 20 atmospheric pressure, overall stenosis reduced from 99% to 0% with TIMI-3 to TIMI-3 flow maintained. Patient has moderate diffuse disease in the mid to distal segment of the LAD and needs aggressive risk factor modification.  Recommendation: Patient will be discharged tomorrow if he remains stable with dual antiplatelet therapy including aspirin indefinitely and Brilinta at least for a period of 1 year.   Echocardiogram 06/26/2019:   1. Left ventricular ejection fraction, by visual estimation, is 55 to 60%. The left ventricle has normal function. There is no left ventricular hypertrophy.  2. Left ventricular diastolic function could not be evaluated pattern of LV diastolic filling.  3. Global right ventricle was not assessed.The right ventricular size is normal. No increase in right ventricular wall thickness.  4. Left atrial size was mildly dilated.  5. Mild mitral valve leaflet thickening with mild mitral regurgitation.  6. Normal right atrial pressure.  EKG 06/26/2019: Sinus rhythm 70 bpm. Nonspecific T wave inversion anteroseptal leads, anterior ischemia. EKG 06/27/2019:  Normal sinus rhythm without evidence of ischemia.  Hospital Course: Patient admitted to the hospital with chest pain suggestive of unstable angina pectoris, had abnormal EKG suggestive of anterior ischemia and also abnormal serum troponin suggestive of non-STEMI.  He underwent coronary angiography on the same day of hospital admission and was found to have high-grade subtotal diffusely diseased LAD for which he underwent successful angioplasty and stenting with a DES.  The following morning he did well, had one episode of chest pain which got easily relieved with nitroglycerin.  There were no EKG abnormalities.  I discussed with the patient and his wife at the bedside, they feel comfortable in going home, he will ambulate in the hallway independently and there is no recurrence of chest pain it was felt that he was stable to be discharged.  He is aware not to stop aspirin or Brilinta.  Effects of Brilinta on breathing and occasional GERD also discussed.  Sublingual nitroglycerin was prescribed and advised how to use this.  I will see him back in 1 week to 10 days for follow-up.  Vitals with BMI 06/27/2019 06/27/2019 06/27/2019  Height - - -  Weight - - -  BMI - - -  Systolic 329 924 268  Diastolic 82 81 93  Pulse 77 - -  Some encounter information is confidential and restricted. Go to Review Flowsheets activity to see all data.    Physical Exam  Constitutional: He appears well-developed and well-nourished. No distress.  HENT:  Head: Atraumatic.  Eyes: Conjunctivae are normal.  Neck: Neck supple. No JVD present. No thyromegaly present.  Cardiovascular: Normal rate, regular rhythm, normal heart sounds and intact distal pulses. Exam reveals no gallop.  No murmur heard. Radial arterial  access site has healed well without any complications.  Pulmonary/Chest: Effort normal and breath sounds normal.  Abdominal: Soft. Bowel sounds are normal.  Musculoskeletal: Normal range of motion.  Neurological: He  is alert.  Skin: Skin is warm and dry.  Psychiatric: He has a normal mood and affect.    Labs:  1d ago (06/26/19) 2d ago (06/25/19) 2d ago (06/25/19)    Troponin I (High Sensitivity) <18 ng/L 14  24High  CM  26High  CM     Lab Results  Component Value Date   WBC 7.2 06/27/2019   HGB 13.6 06/27/2019   HCT 39.3 06/27/2019   MCV 87.5 06/27/2019   PLT 223 06/27/2019    Recent Labs  Lab 06/26/19 1300  NA 141  K 3.7  CL 105  CO2 23  BUN 18  CREATININE 1.14  CALCIUM 9.1  GLUCOSE 86    Lipid Panel     Component Value Date/Time   CHOL 145 06/26/2019 0843   TRIG 94 06/26/2019 0843   HDL 28 (L) 06/26/2019 0843   CHOLHDL 5.2 06/26/2019 0843   VLDL 19 06/26/2019 0843   LDLCALC 98 06/26/2019 0843    Radiology: Dg Chest 2 View  Result Date: 06/25/2019 CLINICAL DATA:  Chest pain EXAM: CHEST - 2 VIEW COMPARISON:  02/13/2016 FINDINGS: The heart size and mediastinal contours are within normal limits. Both lungs are clear. The visualized skeletal structures are unremarkable. IMPRESSION: No active cardiopulmonary disease. Electronically Signed   By: Deatra Robinson M.D.   On: 06/25/2019 22:01   FOLLOW UP PLANS AND APPOINTMENTS Discharge Instructions    Amb Referral to Cardiac Rehabilitation   Complete by: As directed    Diagnosis:  Coronary Stents NSTEMI PTCA     After initial evaluation and assessments completed: Virtual Based Care may be provided alone or in conjunction with Phase 2 Cardiac Rehab based on patient barriers.: Yes     Allergies as of 06/27/2019      Reactions   Sulfonamide Derivatives    REACTION: disoriented      Medication List    TAKE these medications   aspirin EC 81 MG tablet Take 81 mg by mouth at bedtime. What changed: Another medication with the same name was removed. Continue taking this medication, and follow the directions you see here.   atorvastatin 80 MG tablet Commonly known as: LIPITOR Take 1 tablet (80 mg total) by mouth daily at 6  PM.   buPROPion 150 MG 24 hr tablet Commonly known as: WELLBUTRIN XL Take 1 tablet (150 mg total) by mouth daily. What changed: when to take this   metoprolol tartrate 25 MG tablet Commonly known as: LOPRESSOR Take 1 tablet (25 mg total) by mouth 2 (two) times daily.   nitroGLYCERIN 0.4 MG SL tablet Commonly known as: NITROSTAT Place 1 tablet (0.4 mg total) under the tongue every 5 (five) minutes x 3 doses as needed for chest pain.   omeprazole 20 MG capsule Commonly known as: PRILOSEC Take 40 mg by mouth at bedtime.   ticagrelor 90 MG Tabs tablet Commonly known as: BRILINTA Take 1 tablet (90 mg total) by mouth 2 (two) times daily.      Follow-up Information    Toniann Fail, NP On 07/04/2019.   Specialty: Cardiology Why: 8:30 AM bring all medications.  Contact information: [address] [place] [address] [phone]           Yates Decamp, MD 06/27/2019, 1:54 PM  Pager: [phone] Office: [phone] If no answer: [phone]

## 2019-06-27 NOTE — Progress Notes (Signed)
Spoke with Einar Gip, MD concerning pt's chest pain. Stated EKG looked great and would be discharged this afternoon if no other chest pain occurs. Pt resting in bed with 1/10 pain at this time. BP 124/81, HR 65. Will continue to monitor closely.

## 2019-06-27 NOTE — Progress Notes (Signed)
CARDIAC REHAB PHASE I   PRE:  Rate/Rhythm: 64 SR    BP: sitting 148/91    SaO2:   MODE:  Ambulation: 430 ft   POST:  Rate/Rhythm: 78 SR    BP: sitting 150/104     SaO2:   Tolerated well, no c/o. BP elevated. Sts he doesn't know what his BP is. Sounds like it was somewhat elevated at PCP recently but no rx given. Ed completed. Receptive to MI, stent, Brilinta, restrictions, diet, exercise (already walking 1 hr quickly daily), NTG, and CRPII. Will refer to [facility]. He understands the importance of Brilinta. Pt is interested in participating in Virtual Cardiac and Pulmonary Rehab. Pt advised that Virtual Cardiac and Pulmonary Rehab is provided at no cost to the patient.  Checklist:  1. Pt has smart device  ie smartphone and/or ipad for downloading an app  Yes 2. Reliable internet/wifi service    Yes 3. Understands how to use their smartphone and navigate within an app.  Yes Pt verbalized understanding and is in agreement.  [place], ACSM 06/27/2019 8:54 AM

## 2019-06-27 NOTE — TOC Transition Note (Signed)
Transition of Care [facility]) - CM/SW Discharge Note   Patient Details  Name: Kacper Cartlidge MRN: [id number] Date of Birth: 06/09/1966  [facility]) CM/SW Contact:  Bartholomew Crews, RN Phone Number: [phone] 06/27/2019, 3:10 PM   Clinical Narrative:    PTA home with spouse per chart review. Received call from bedside nurse. Patient to transition home today. New medication prescribed - Brilinta - copay $30/month or $75/90days. $5 copay card given to patient by nurse. No further transition of care needs identified.    Final next level of care: Home/Self Care Barriers to Discharge: No Barriers Identified   Patient Goals and CMS Choice     Choice offered to / list presented to : NA  Discharge Placement                       Discharge Plan and Services                DME Arranged: N/A         HH Arranged: NA HH Agency: NA        Social Determinants of Health (SDOH) Interventions     Readmission Risk Interventions No flowsheet data found.

## 2019-06-27 NOTE — Plan of Care (Signed)
  Problem: Education: Goal: Understanding of CV disease, CV risk reduction, and recovery process will improve Outcome: Completed/Met Goal: Individualized Educational Video(s) Outcome: Completed/Met   Problem: Activity: Goal: Ability to return to baseline activity level will improve Outcome: Completed/Met   Problem: Cardiovascular: Goal: Ability to achieve and maintain adequate cardiovascular perfusion will improve Outcome: Completed/Met Goal: Vascular access site(s) Level 0-1 will be maintained Outcome: Completed/Met   Problem: Health Behavior/Discharge Planning: Goal: Ability to safely manage health-related needs after discharge will improve Outcome: Completed/Met

## 2019-06-27 NOTE — TOC Benefit Eligibility Note (Signed)
Transition of Care [facility]) Benefit Eligibility Note    Patient Details  Name: Jon Shaw MRN: [id number] Date of Birth: [birth year]   Medication/Dose: Brilinta 90 mg twice a day     Tier: Other(No tier level)  Prescription Coverage Preferred Pharmacy: [facility] with Person/Company/Phone Number:: [employer]  Co-Pay: 30.00 for a 3o day supply 75.00 for a 90 day supply  Prior Approval: No  Deductible: (No deductible)       Orbie Pyo Phone Number: 06/27/2019, 2:20 PM

## 2019-06-28 ENCOUNTER — Telehealth: Payer: Self-pay

## 2019-06-28 NOTE — Telephone Encounter (Signed)
Location of hospitalization: [place] Reason for hospitalization: NSTEMI Date of discharge: 06/27/19 Date of first communication with patient: today Person contacting patient: Jon Shaw Current symptoms: NONE Do you understand why you were in the Hospital: Yes Questions regarding discharge instructions: None Where were you discharged to: Home Medications reviewed: Yes Allergies reviewed: Yes Dietary changes reviewed: Yes. Heart health diet.  Referals reviewed: NA Activities of Daily Living: Able to with mild limitations Any transportation issues/concerns: None Any patient concerns: None Confirmed importance & date/time of Follow up appt: Yes 07/04/19 @8 :30  Confirmed with patient if condition begins to worsen call. Pt was given the office number and encouraged to call back with questions or concerns: Yes

## 2019-07-04 ENCOUNTER — Encounter: Payer: Self-pay | Admitting: Cardiology

## 2019-07-04 ENCOUNTER — Other Ambulatory Visit: Payer: Self-pay

## 2019-07-04 ENCOUNTER — Ambulatory Visit: Payer: BC Managed Care – PPO | Admitting: Cardiology

## 2019-07-04 VITALS — BP 128/93 | HR 58 | Temp 97.3°F | Ht 75.5 in | Wt 197.0 lb

## 2019-07-04 DIAGNOSIS — I2510 Atherosclerotic heart disease of native coronary artery without angina pectoris: Secondary | ICD-10-CM | POA: Diagnosis not present

## 2019-07-04 DIAGNOSIS — I10 Essential (primary) hypertension: Secondary | ICD-10-CM

## 2019-07-04 DIAGNOSIS — E782 Mixed hyperlipidemia: Secondary | ICD-10-CM

## 2019-07-04 MED ORDER — LISINOPRIL 10 MG PO TABS: 10.0000 mg | ORAL_TABLET | Freq: Every day | ORAL | 2 refills | Status: DC

## 2019-07-04 NOTE — Progress Notes (Signed)
Primary Physician:  Deloria Lair., MD   Patient ID: Jon Shaw, male    DOB: April 07, 1966, 53 y.o.   MRN: [id number]  Subjective:    Chief Complaint  Patient presents with  . Chest Pain    follow up    HPI: Jon Shaw  is a 53 y.o. male  with hypertriglycerdemia, recently admitted to the hospital on 06/26/2019 with unstable angina and NSTEMI. Underwent coronary angiogram same day, found to have high-grade subtotal diffusely diseased LAD for which he underwent successful angioplasty and stenting with a DES. Now presents for hospital follow up.  He was discharged on DAPT.  He is presently doing well without any significant complaints.  Tolerating medications well.  No complications with right radial access site.  He does admit to doing some lifting since being home from the hospital, which he tolerated well.  He is without history of tobacco use.  Does have family history of CAD in his father.  Past Medical History:  Diagnosis Date  . Chest tightness    troponin elevation 0.4. cath [facility] 03/03/10 - normal  . Depression   . GERD (gastroesophageal reflux disease)   . History of cardiac catheterization    EF 60% 6/11  . Hypertriglyceridemia     Past Surgical History:  Procedure Laterality Date  . CARDIAC CATHETERIZATION    . CORONARY STENT INTERVENTION N/A 06/26/2019   Procedure: CORONARY STENT INTERVENTION;  Surgeon: Adrian Prows, MD;  Location: [place] [facility];  Service: Cardiovascular;  Laterality: N/A;  . LEFT HEART CATH AND CORONARY ANGIOGRAPHY N/A 06/26/2019   Procedure: LEFT HEART CATH AND CORONARY ANGIOGRAPHY;  Surgeon: Adrian Prows, MD;  Location: [place] [facility];  Service: Cardiovascular;  Laterality: N/A;    Social History   Socioeconomic History  . Marital status: Married    Spouse name: Not on file  . Number of children: Not on file  . Years of education: Not on file  . Highest education level: Not on file  Occupational History  . Not on file  Social  Needs  . Financial resource strain: Not on file  . Food insecurity    Worry: Not on file    Inability: Not on file  . Transportation needs    Medical: Not on file    Non-medical: Not on file  Tobacco Use  . Smoking status: Never Smoker  . Smokeless tobacco: Never Used  Substance and Sexual Activity  . Alcohol use: No  . Drug use: No  . Sexual activity: Never  Lifestyle  . Physical activity    Days per week: Not on file    Minutes per session: Not on file  . Stress: Not on file  Relationships  . Social Herbalist on phone: Not on file    Gets together: Not on file    Attends religious service: Not on file    Active member of club or organization: Not on file    Attends meetings of clubs or organizations: Not on file    Relationship status: Not on file  . Intimate partner violence    Fear of current or ex partner: Not on file    Emotionally abused: Not on file    Physically abused: Not on file    Forced sexual activity: Not on file  Other Topics Concern  . Not on file  Social History Narrative   Married, 2 children.   [employer] but currently out of work.  Review of Systems  Constitution: Negative for decreased appetite, malaise/fatigue, weight gain and weight loss.  Eyes: Negative for visual disturbance.  Cardiovascular: Negative for chest pain, claudication, dyspnea on exertion, leg swelling, orthopnea, palpitations and syncope.  Respiratory: Negative for hemoptysis and wheezing.   Endocrine: Negative for cold intolerance and heat intolerance.  Hematologic/Lymphatic: Does not bruise/bleed easily.  Skin: Negative for nail changes.  Musculoskeletal: Negative for muscle weakness and myalgias.  Gastrointestinal: Negative for abdominal pain, change in bowel habit, nausea and vomiting.  Neurological: Negative for difficulty with concentration, dizziness, focal weakness and headaches.  Psychiatric/Behavioral: Negative for altered mental status and  suicidal ideas.  All other systems reviewed and are negative.     Objective:  Blood pressure (!) 128/93, pulse (!) 58, temperature (!) 97.3 F (36.3 C), height 6' 3.5" (1.918 m), weight 197 lb (89.4 kg), SpO2 97 %. Body mass index is 24.3 kg/m.    Physical Exam  Constitutional: He is oriented to person, place, and time. Vital signs are normal. He appears well-developed and well-nourished.  HENT:  Head: Normocephalic and atraumatic.  Neck: Normal range of motion.  Cardiovascular: Normal rate, regular rhythm, normal heart sounds and intact distal pulses.  Pulmonary/Chest: Effort normal and breath sounds normal. No accessory muscle usage. No respiratory distress.  Abdominal: Soft. Bowel sounds are normal.  Musculoskeletal: Normal range of motion.  Neurological: He is alert and oriented to person, place, and time.  Skin: Skin is warm and dry.  Vitals reviewed.  Radiology: No results found.  Laboratory examination:    CMP Latest Ref Rng & Units 06/26/2019 06/25/2019 05/12/2016  Glucose 70 - 99 mg/dL 86 92 161(W)  BUN 6 - 20 mg/dL Creatinine 0.61 - 1.24 mg/dL 9.60 4.54(U) 9.81  Sodium 135 - 145 mmol/L 141 141 138  Potassium 3.5 - 5.1 mmol/L 3.7 3.2(L) 3.7  Chloride 98 - 111 mmol/L 105 105 106  CO2 22 - 32 mmol/L Calcium 8.9 - 10.3 mg/dL 9.1 9.1 9.3  Total Protein 6.5 - 8.1 g/dL - - 8.1  Total Bilirubin 0.3 - 1.2 mg/dL - - 0.8  Alkaline Phos 38 - 126 U/L - - 52  AST 15 - 41 U/L - - 20  ALT 17 - 63 U/L - - 24   CBC Latest Ref Rng & Units 06/27/2019 06/25/2019 05/12/2016  WBC 4.0 - 10.5 K/uL 7.2 6.9 8.4  Hemoglobin 13.0 - 17.0 g/dL 19.1 47.8 29.5  Hematocrit 39.0 - 52.0 % 39.3 43.3 45.3  Platelets 150 - 400 K/uL 223 249 248   Lipid Panel     Component Value Date/Time   CHOL 145 06/26/2019 0843   TRIG 94 06/26/2019 0843   HDL 28 (L) 06/26/2019 0843   CHOLHDL 5.2 06/26/2019 0843   VLDL 19 06/26/2019 0843   LDLCALC 98 06/26/2019 0843   HEMOGLOBIN A1C Lab  Results  Component Value Date   HGBA1C (H) 03/03/2010    5.7 (NOTE)                                                                       According to the ADA Clinical Practice Recommendations for 2011, when HbA1c is used as a screening test:   >=  6.5%   Diagnostic of Diabetes Mellitus           (if abnormal result  is confirmed)  5.7-6.4%   Increased risk of developing Diabetes Mellitus  References:Diagnosis and Classification of Diabetes Mellitus,Diabetes Care,2011,34(Suppl 1):S62-S69 and Standards of Medical Care in         Diabetes - 2011,Diabetes Care,2011,34  (Suppl 1):S11-S61.   MPG 117 (H) 03/03/2010   TSH No results for input(s): TSH in the last 8760 hours.  PRN Meds:. There are no discontinued medications. Current Meds  Medication Sig  . aspirin EC 81 MG tablet Take 81 mg by mouth at bedtime.  Marland Kitchen atorvastatin (LIPITOR) 80 MG tablet Take 1 tablet (80 mg total) by mouth daily at 6 PM.  . buPROPion (WELLBUTRIN XL) 150 MG 24 hr tablet Take 1 tablet (150 mg total) by mouth daily. (Patient taking differently: Take 150 mg by mouth at bedtime. )  . metoprolol tartrate (LOPRESSOR) 25 MG tablet Take 1 tablet (25 mg total) by mouth 2 (two) times daily.  . nitroGLYCERIN (NITROSTAT) 0.4 MG SL tablet Place 1 tablet (0.4 mg total) under the tongue every 5 (five) minutes x 3 doses as needed for chest pain.  Marland Kitchen omeprazole (PRILOSEC) 20 MG capsule Take 40 mg by mouth at bedtime.  . ticagrelor (BRILINTA) 90 MG TABS tablet Take 1 tablet (90 mg total) by mouth 2 (two) times daily.    Cardiac Studies:   Echocardiogram 06/26/2019:   1. Left ventricular ejection fraction, by visual estimation, is 55 to 60%. The left ventricle has normal function. There is no left ventricular hypertrophy. 2. Left ventricular diastolic function could not be evaluated pattern of LV diastolic filling. 3. Global right ventricle was not assessed.The right ventricular size is normal. No increase in right ventricular wall  thickness. 4. Left atrial size was mildly dilated. 5. Mild mitral valve leaflet thickening with mild mitral regurgitation. 6. Normal right atrial pressure.  Coronary angiogram 06/26/2019: Normal LVEF at 65 to 70% with mild intraventricular pressure gradient at the apex, no pressure gradient across the aortic valve. Normal EDP. Mild disease in RCA and circumflex. LAD is diffusely diseased, mid segment has 99% stenosis, D1 has 40% stenosis. Successful stenting of mid LAD with implantation of 3.0 x 28 mm Synergy DES, postdilated with 3.25 x 15 mm [place] at 20 atmospheric pressure and distal and postdilated with 2.5 x 12 mm emerge [place] at 20 atmospheric pressure, overall stenosis reduced from 99% to 0% with TIMI-3 to TIMI-3 flow maintained. Patient has moderate diffuse disease in the mid to distal segment of the LAD and needs aggressive risk factor modification.  Assessment:   Atherosclerosis of native coronary artery of native heart without angina pectoris - Plan: EKG 12-Lead, Basic metabolic panel  Primary hypertension  Mixed hyperlipidemia  EKG 07/04/2019: Sinus bradycardia at 58 bpm, normal axis, no evidence of ischemia.   Recommendations:   Patient is here for hospital follow-up, he is doing well without any complaints today.  He has not had any chest pain or shortness of breath.  Tolerating medications well.  He is planning to start cardiac repeat Pap in the next few weeks.  He does admit to doing some lifting since being home, encouraged him to hold off on this for at least 1 more week and he may gradually resume his normal activities.  His blood pressure does continue to be elevated, will add lisinopril 10 mg daily.  Will check BMP in 2 weeks for surveillance.  He was started on Lipitor at discharge, LDL noted to be 98.  His new goal will be LDL less than 70.  He will continue to need aggressive risk factor modification in view of diffuse disease.  We will repeat his lipids in the  next few weeks.  I will see him back in 6 to 8 weeks for follow-up on hypertension.   Toniann FailAshton Haynes Thersa Mohiuddin, MSN, APRN, FNP-C [facility] Office: [phone] Fax: [phone]

## 2019-07-11 DIAGNOSIS — I249 Acute ischemic heart disease, unspecified: Secondary | ICD-10-CM | POA: Diagnosis not present

## 2019-07-11 DIAGNOSIS — Z95820 Peripheral vascular angioplasty status with implants and grafts: Secondary | ICD-10-CM | POA: Diagnosis not present

## 2019-07-11 DIAGNOSIS — Z09 Encounter for follow-up examination after completed treatment for conditions other than malignant neoplasm: Secondary | ICD-10-CM | POA: Diagnosis not present

## 2019-07-11 DIAGNOSIS — F322 Major depressive disorder, single episode, severe without psychotic features: Secondary | ICD-10-CM | POA: Diagnosis not present

## 2019-07-19 ENCOUNTER — Other Ambulatory Visit: Payer: Self-pay | Admitting: Cardiology

## 2019-08-02 DIAGNOSIS — I2510 Atherosclerotic heart disease of native coronary artery without angina pectoris: Secondary | ICD-10-CM | POA: Diagnosis not present

## 2019-08-03 LAB — BASIC METABOLIC PANEL
BUN/Creatinine Ratio: 12 (ref 9–20)
BUN: 14 mg/dL (ref 6–24)
CO2: 23 mmol/L (ref 20–29)
Calcium: 9.2 mg/dL (ref 8.7–10.2)
Chloride: 105 mmol/L (ref 96–106)
Creatinine, Ser: 1.15 mg/dL (ref 0.76–1.27)
GFR calc Af Amer: 85 mL/min/{1.73_m2} (ref >59)
GFR calc non Af Amer: 73 mL/min/{1.73_m2} (ref >59)
Glucose: 82 mg/dL (ref 65–99)
Potassium: 4.7 mmol/L (ref 3.5–5.2)
Sodium: 141 mmol/L (ref 134–144)

## 2019-08-14 NOTE — Progress Notes (Signed)
Primary Physician:  Deloria Lair., MD   Patient ID: Jon Shaw, male    DOB: 1965/11/24, 53 y.o.   MRN: [id number]  Subjective:    Chief Complaint  Patient presents with  . Hypertension  . Coronary Artery Disease  . Follow-up    6 week    HPI: Jon Shaw  is a 53 y.o. male  with hypertriglycerdemia, recently admitted to the hospital on 06/26/2019 with unstable angina and NSTEMI. Underwent coronary angiogram same day, found to have high-grade subtotal diffusely diseased LAD for which he underwent successful angioplasty and stenting with a DES. He was started on Lisinopril 10 mg at his last office visit and now presents for follow up.  He is tolerating lisinopril well, diastolic readings have continued to be in the upper 80 range.  He has not had any chest discomfort.  He has resumed all normal activities and is tolerating this well.  He does admit to not sleeping well at night.  Admits to some snoring and waking up feeling tired.  Feels that he may need a new mattress as he often wakes up having to change positions due to discomfort.  Continues to be on dual antiplatelet therapy and tolerates this well.  He does report approximately twice per week having episodes while resting of fluttering or flip-flopping sensation in his chest.  No associated near syncope or shortness of breath.  Can last for a few minutes.  He has increased his intake of green tea lately.  He is without history of tobacco use.  Does have family history of CAD in his father.  Past Medical History:  Diagnosis Date  . Chest tightness    troponin elevation 0.4. cath [facility] 03/03/10 - normal  . Coronary artery disease   . Depression   . GERD (gastroesophageal reflux disease)   . History of cardiac catheterization    EF 60% 6/11  . Hypertension   . Hypertriglyceridemia     Past Surgical History:  Procedure Laterality Date  . CARDIAC CATHETERIZATION    . CORONARY STENT INTERVENTION N/A 06/26/2019   Procedure: CORONARY STENT INTERVENTION;  Surgeon: Adrian Prows, MD;  Location: [place] [facility];  Service: Cardiovascular;  Laterality: N/A;  . LEFT HEART CATH AND CORONARY ANGIOGRAPHY N/A 06/26/2019   Procedure: LEFT HEART CATH AND CORONARY ANGIOGRAPHY;  Surgeon: Adrian Prows, MD;  Location: [place] [facility];  Service: Cardiovascular;  Laterality: N/A;    Social History   Socioeconomic History  . Marital status: Married    Spouse name: Not on file  . Number of children: 2  . Years of education: Not on file  . Highest education level: Not on file  Occupational History  . Not on file  Social Needs  . Financial resource strain: Not on file  . Food insecurity    Worry: Not on file    Inability: Not on file  . Transportation needs    Medical: Not on file    Non-medical: Not on file  Tobacco Use  . Smoking status: Never Smoker  . Smokeless tobacco: Never Used  Substance and Sexual Activity  . Alcohol use: No  . Drug use: No  . Sexual activity: Never  Lifestyle  . Physical activity    Days per week: Not on file    Minutes per session: Not on file  . Stress: Not on file  Relationships  . Social Herbalist on phone: Not on file    Gets  together: Not on file    Attends religious service: Not on file    Active member of club or organization: Not on file    Attends meetings of clubs or organizations: Not on file    Relationship status: Not on file  . Intimate partner violence    Fear of current or ex partner: Not on file    Emotionally abused: Not on file    Physically abused: Not on file    Forced sexual activity: Not on file  Other Topics Concern  . Not on file  Social History Narrative   Married, 2 children.   [employer] but currently out of work.     Review of Systems  Constitution: Negative for decreased appetite, malaise/fatigue, weight gain and weight loss.  Eyes: Negative for visual disturbance.  Cardiovascular: Negative for chest pain,  claudication, dyspnea on exertion, leg swelling, orthopnea, palpitations and syncope.  Respiratory: Negative for hemoptysis and wheezing.   Endocrine: Negative for cold intolerance and heat intolerance.  Hematologic/Lymphatic: Does not bruise/bleed easily.  Skin: Negative for nail changes.  Musculoskeletal: Negative for muscle weakness and myalgias.  Gastrointestinal: Negative for abdominal pain, change in bowel habit, nausea and vomiting.  Neurological: Negative for difficulty with concentration, dizziness, focal weakness and headaches.  Psychiatric/Behavioral: Negative for altered mental status and suicidal ideas.  All other systems reviewed and are negative.     Objective:  Blood pressure 138/89, pulse (!) 55, temperature 98 F (36.7 C), weight 198 lb (89.8 kg), SpO2 98 %. Body mass index is 24.42 kg/m.    Physical Exam  Constitutional: He is oriented to person, place, and time. Vital signs are normal. He appears well-developed and well-nourished.  HENT:  Head: Normocephalic and atraumatic.  Neck: Normal range of motion.  Cardiovascular: Normal rate, regular rhythm, normal heart sounds and intact distal pulses.  Pulmonary/Chest: Effort normal and breath sounds normal. No accessory muscle usage. No respiratory distress.  Abdominal: Soft. Bowel sounds are normal.  Musculoskeletal: Normal range of motion.  Neurological: He is alert and oriented to person, place, and time.  Skin: Skin is warm and dry.  Vitals reviewed.  Radiology: No results found.  Laboratory examination:    CMP Latest Ref Rng & Units 08/02/2019 06/26/2019 06/25/2019  Glucose 65 - 99 mg/dL 82 86 92  BUN 6 - 24 mg/dL Creatinine 0.76 - 1.27 mg/dL 1.61 0.96 0.45(W)  Sodium 134 - 144 mmol/L 141 141 141  Potassium 3.5 - 5.2 mmol/L 4.7 3.7 3.2(L)  Chloride 96 - 106 mmol/L 105 105 105  CO2 20 - 29 mmol/L Calcium 8.7 - 10.2 mg/dL 9.2 9.1 9.1  Total Protein 6.5 - 8.1 g/dL - - -  Total Bilirubin  0.3 - 1.2 mg/dL - - -  Alkaline Phos 38 - 126 U/L - - -  AST 15 - 41 U/L - - -  ALT 17 - 63 U/L - - -   CBC Latest Ref Rng & Units 06/27/2019 06/25/2019 05/12/2016  WBC 4.0 - 10.5 K/uL 7.2 6.9 8.4  Hemoglobin 13.0 - 17.0 g/dL 09.8 11.9 14.7  Hematocrit 39.0 - 52.0 % 39.3 43.3 45.3  Platelets 150 - 400 K/uL 223 249 248   Lipid Panel     Component Value Date/Time   CHOL 145 06/26/2019 0843   TRIG 94 06/26/2019 0843   HDL 28 (L) 06/26/2019 0843   CHOLHDL 5.2 06/26/2019 0843   VLDL 19 06/26/2019 0843   LDLCALC  98 06/26/2019 0843   HEMOGLOBIN A1C Lab Results  Component Value Date   HGBA1C (H) 03/03/2010    5.7 (NOTE)                                                                       According to the ADA Clinical Practice Recommendations for 2011, when HbA1c is used as a screening test:   >=6.5%   Diagnostic of Diabetes Mellitus           (if abnormal result  is confirmed)  5.7-6.4%   Increased risk of developing Diabetes Mellitus  References:Diagnosis and Classification of Diabetes Mellitus,Diabetes Care,2011,34(Suppl 1):S62-S69 and Standards of Medical Care in         Diabetes - 2011,Diabetes Care,2011,34  (Suppl 1):S11-S61.   MPG 117 (H) 03/03/2010   TSH No results for input(s): TSH in the last 8760 hours.  PRN Meds:. Medications Discontinued During This Encounter  Medication Reason  . lisinopril (ZESTRIL) 10 MG tablet Dose change   Current Meds  Medication Sig  . aspirin EC 81 MG tablet Take 81 mg by mouth at bedtime.  Marland Kitchen atorvastatin (LIPITOR) 80 MG tablet Take 1 tablet (80 mg total) by mouth daily at 6 PM.  . BRILINTA 90 MG TABS tablet TAKE 1 TABLET BY MOUTH TWICE A DAY  . buPROPion (WELLBUTRIN XL) 150 MG 24 hr tablet Take 1 tablet (150 mg total) by mouth daily. (Patient taking differently: Take 150 mg by mouth at bedtime. )  . metoprolol tartrate (LOPRESSOR) 25 MG tablet Take 1 tablet (25 mg total) by mouth 2 (two) times daily.  . nitroGLYCERIN (NITROSTAT) 0.4 MG SL  tablet Place 1 tablet (0.4 mg total) under the tongue every 5 (five) minutes x 3 doses as needed for chest pain.  Marland Kitchen omeprazole (PRILOSEC) 20 MG capsule Take 40 mg by mouth at bedtime.  . sildenafil (VIAGRA) 100 MG tablet Take 1 tablet by mouth daily as needed.  . [DISCONTINUED] lisinopril (ZESTRIL) 10 MG tablet Take 1 tablet (10 mg total) by mouth daily.    Cardiac Studies:   Echocardiogram 06/26/2019:   1. Left ventricular ejection fraction, by visual estimation, is 55 to 60%. The left ventricle has normal function. There is no left ventricular hypertrophy. 2. Left ventricular diastolic function could not be evaluated pattern of LV diastolic filling. 3. Global right ventricle was not assessed.The right ventricular size is normal. No increase in right ventricular wall thickness. 4. Left atrial size was mildly dilated. 5. Mild mitral valve leaflet thickening with mild mitral regurgitation. 6. Normal right atrial pressure.  Coronary angiogram 06/26/2019: Normal LVEF at 65 to 70% with mild intraventricular pressure gradient at the apex, no pressure gradient across the aortic valve. Normal EDP. Mild disease in RCA and circumflex. LAD is diffusely diseased, mid segment has 99% stenosis, D1 has 40% stenosis. Successful stenting of mid LAD with implantation of 3.0 x 28 mm Synergy DES, postdilated with 3.25 x 15 mm [place] at 20 atmospheric pressure and distal and postdilated with 2.5 x 12 mm emerge [place] at 20 atmospheric pressure, overall stenosis reduced from 99% to 0% with TIMI-3 to TIMI-3 flow maintained. Patient has moderate diffuse disease in the mid to distal segment of the LAD and  needs aggressive risk factor modification.  Assessment:   Atherosclerosis of native coronary artery of native heart without angina pectoris  Primary hypertension  Mixed hyperlipidemia - Plan: Lipid Profile, Comprehensive Metabolic Panel (CMET)  Palpitations  EKG 07/04/2019: Sinus bradycardia at 58  bpm, normal axis, no evidence of ischemia.   Recommendations:   Patient presents for 6 week follow up for hypertension. Blood pressure has improved with addition of Lisinopril, but continues to be slightly elevated. Kidney function has been stable. I will further increase lisinopril to 20 mg daily. Continue with low sodium diet.  He has not had any symptoms of angina. Continue with DAPT. No clinical evidence of heart failure. He will need reevaluation of his lipids, will obtain lipid and CMP prior to next office visit.   He does have symptoms concerning for sleep apnea. He is reluctant to having evaluation. I have encouraged him to consider and to also potentially get a new mattress and see if this is contributing due to his complaints of discomfort at night. If he continues to have difficulty, will consider sleep study referral.  His symptoms of palpitations are suggestive of PAC or PVC's. I have discussed etiology of this and reassured. I have encouraged him to cut back on green tea use to see if this will improve. I will plan to see him back in 3 months after his labs for follow up.    Toniann FailAshton Haynes , MSN, APRN, FNP-C [facility] [place] Office: [phone] Fax: [phone]

## 2019-08-15 ENCOUNTER — Ambulatory Visit: Payer: BC Managed Care – PPO | Admitting: Cardiology

## 2019-08-15 ENCOUNTER — Other Ambulatory Visit: Payer: Self-pay

## 2019-08-15 ENCOUNTER — Encounter: Payer: Self-pay | Admitting: Cardiology

## 2019-08-15 VITALS — BP 138/89 | HR 55 | Temp 98.0°F | Wt 198.0 lb

## 2019-08-15 DIAGNOSIS — E782 Mixed hyperlipidemia: Secondary | ICD-10-CM | POA: Diagnosis not present

## 2019-08-15 DIAGNOSIS — R002 Palpitations: Secondary | ICD-10-CM | POA: Diagnosis not present

## 2019-08-15 DIAGNOSIS — I2510 Atherosclerotic heart disease of native coronary artery without angina pectoris: Secondary | ICD-10-CM

## 2019-08-15 DIAGNOSIS — I10 Essential (primary) hypertension: Secondary | ICD-10-CM | POA: Diagnosis not present

## 2019-08-15 MED ORDER — LISINOPRIL 20 MG PO TABS: 20.0000 mg | ORAL_TABLET | Freq: Every day | ORAL | 3 refills | Status: AC

## 2019-11-15 ENCOUNTER — Encounter: Payer: Self-pay | Admitting: Cardiology

## 2019-11-15 ENCOUNTER — Ambulatory Visit: Payer: BC Managed Care – PPO | Admitting: Cardiology

## 2019-11-15 ENCOUNTER — Other Ambulatory Visit: Payer: Self-pay

## 2019-11-15 VITALS — BP 126/82 | HR 58 | Temp 97.1°F | Ht 75.5 in | Wt 198.0 lb

## 2019-11-15 DIAGNOSIS — I10 Essential (primary) hypertension: Secondary | ICD-10-CM

## 2019-11-15 DIAGNOSIS — I2510 Atherosclerotic heart disease of native coronary artery without angina pectoris: Secondary | ICD-10-CM | POA: Diagnosis not present

## 2019-11-15 DIAGNOSIS — E782 Mixed hyperlipidemia: Secondary | ICD-10-CM

## 2019-11-15 NOTE — Progress Notes (Signed)
Primary Physician:  Kela Millin, MD   Patient ID: Jon Shaw, male    DOB: 12/12/1965, 54 y.o.   MRN: [id number]  Subjective:    Chief Complaint  Patient presents with  . Coronary Artery Disease    3 month f/u, pt has no complaints    HPI: Jon Shaw  is a 54 y.o. male  with hypertriglycerdemia, on 06/26/2019 with NSTEMI. Underwent coronary angiogram same day, found to have high-grade subtotal diffusely diseased LAD for which he underwent successful angioplasty and stenting with a DES. He was started on Lisinopril 10 mg at his last office visit and now presents for follow up.  Patient is here on a 51-month office visit and follow-up for CAD. Doing well without any complaints. Tolerating medications well. Blood pressure has been well controlled at home. He has been exercising regularly and tolerating this well.  He is without history of tobacco use.  Does have family history of CAD in his father.  Past Medical History:  Diagnosis Date  . Chest tightness    troponin elevation 0.4. cath [facility] 03/03/10 - normal  . Coronary artery disease   . Depression   . GERD (gastroesophageal reflux disease)   . History of cardiac catheterization    EF 60% 6/11  . Hypertension   . Hypertriglyceridemia     Past Surgical History:  Procedure Laterality Date  . CARDIAC CATHETERIZATION    . CORONARY STENT INTERVENTION N/A 06/26/2019   Procedure: CORONARY STENT INTERVENTION;  Surgeon: Yates Decamp, MD;  Location: [facility];  Service: Cardiovascular;  Laterality: N/A;  . LEFT HEART CATH AND CORONARY ANGIOGRAPHY N/A 06/26/2019   Procedure: LEFT HEART CATH AND CORONARY ANGIOGRAPHY;  Surgeon: Yates Decamp, MD;  Location: [facility];  Service: Cardiovascular;  Laterality: N/A;    Social History   Socioeconomic History  . Marital status: Married    Spouse name: Not on file  . Number of children: 2  . Years of education: Not on file  . Highest education level: Not on file    Occupational History  . Not on file  Tobacco Use  . Smoking status: Never Smoker  . Smokeless tobacco: Never Used  Substance and Sexual Activity  . Alcohol use: No  . Drug use: No  . Sexual activity: Never  Other Topics Concern  . Not on file  Social History Narrative   Married, 2 children.   [employer] but currently out of work.    Social Determinants of Health   Financial Resource Strain:   . Difficulty of Paying Living Expenses: Not on file  Food Insecurity:   . Worried About Programme researcher, broadcasting/film/video in the Last Year: Not on file  . Ran Out of Food in the Last Year: Not on file  Transportation Needs:   . Lack of Transportation (Medical): Not on file  . Lack of Transportation (Non-Medical): Not on file  Physical Activity:   . Days of Exercise per Week: Not on file  . Minutes of Exercise per Session: Not on file  Stress:   . Feeling of Stress : Not on file  Social Connections:   . Frequency of Communication with Friends and Family: Not on file  . Frequency of Social Gatherings with Friends and Family: Not on file  . Attends Religious Services: Not on file  . Active Member of Clubs or Organizations: Not on file  . Attends Banker Meetings: Not on file  . Marital  Status: Not on file  Intimate Partner Violence:   . Fear of Current or Ex-Partner: Not on file  . Emotionally Abused: Not on file  . Physically Abused: Not on file  . Sexually Abused: Not on file    Review of Systems  Constitution: Negative for decreased appetite, malaise/fatigue, weight gain and weight loss.  Eyes: Negative for visual disturbance.  Cardiovascular: Negative for chest pain, claudication, dyspnea on exertion, leg swelling, orthopnea, palpitations and syncope.  Respiratory: Negative for hemoptysis and wheezing.   Endocrine: Negative for cold intolerance and heat intolerance.  Hematologic/Lymphatic: Does not bruise/bleed easily.  Skin: Negative for nail changes.   Musculoskeletal: Negative for muscle weakness and myalgias.  Gastrointestinal: Negative for abdominal pain, change in bowel habit, nausea and vomiting.  Neurological: Negative for difficulty with concentration, dizziness, focal weakness and headaches.  Psychiatric/Behavioral: Negative for altered mental status and suicidal ideas.  All other systems reviewed and are negative.     Objective:  Blood pressure 126/82, pulse (!) 58, temperature (!) 97.1 F (36.2 C), height 6' 3.5" (1.918 m), weight 198 lb (89.8 kg), SpO2 96 %. Body mass index is 24.42 kg/m.    Physical Exam  Constitutional: He is oriented to person, place, and time. Vital signs are normal. He appears well-developed and well-nourished.  HENT:  Head: Normocephalic and atraumatic.  Cardiovascular: Normal rate, regular rhythm, normal heart sounds and intact distal pulses.  Pulmonary/Chest: Effort normal and breath sounds normal. No accessory muscle usage. No respiratory distress.  Abdominal: Soft. Bowel sounds are normal.  Musculoskeletal:        General: Normal range of motion.     Cervical back: Normal range of motion.  Neurological: He is alert and oriented to person, place, and time.  Skin: Skin is warm and dry.  Vitals reviewed.  Radiology: No results found.  Laboratory examination:    CMP Latest Ref Rng & Units 08/02/2019 06/26/2019 06/25/2019  Glucose 65 - 99 mg/dL 82 86 92  BUN 6 - 24 mg/dL 14 18 16   Creatinine 0.76 - 1.27 mg/dL 8.29 5.62)  Sodium 134 - 144 mmol/L 141 141 141  Potassium 3.5 - 5.2 mmol/L 4.7 3.7 3.2(L)  Chloride 96 - 106 mmol/L 105 105 105  CO2 20 - 29 mmol/L 23 23 26   Calcium 8.7 - 10.2 mg/dL 9.2 9.1 9.1  Total Protein 6.5 - 8.1 g/dL - - -  Total Bilirubin 0.3 - 1.2 mg/dL - - -  Alkaline Phos 38 - 126 U/L - - -  AST 15 - 41 U/L - - -  ALT 17 - 63 U/L - - -   CBC Latest Ref Rng & Units 06/27/2019 06/25/2019 05/12/2016  WBC 4.0 - 10.5 K/uL 7.2 6.9 8.4  Hemoglobin 13.0 - 17.0 g/dL 08/25/2019  05/14/2016 65.7  Hematocrit 39.0 - 52.0 % 39.3 43.3 45.3  Platelets 150 - 400 K/uL 223 249 248   Lipid Panel     Component Value Date/Time   CHOL 145 06/26/2019 0843   TRIG 94 06/26/2019 0843   HDL 28 (L) 06/26/2019 0843   CHOLHDL 5.2 06/26/2019 0843   VLDL 19 06/26/2019 0843   LDLCALC 98 06/26/2019 0843   HEMOGLOBIN A1C Lab Results  Component Value Date   HGBA1C (H) 03/03/2010    5.7 (NOTE)  According to the ADA Clinical Practice Recommendations for 2011, when HbA1c is used as a screening test:   >=6.5%   Diagnostic of Diabetes Mellitus           (if abnormal result  is confirmed)  5.7-6.4%   Increased risk of developing Diabetes Mellitus  References:Diagnosis and Classification of Diabetes Mellitus,Diabetes Care,2011,34(Suppl 1):S62-S69 and Standards of Medical Care in         Diabetes - 2011,Diabetes Care,2011,34  (Suppl 1):S11-S61.   MPG 117 (H) 03/03/2010   TSH No results for input(s): TSH in the last 8760 hours.  PRN Meds:. There are no discontinued medications. Current Meds  Medication Sig  . aspirin EC 81 MG tablet Take 81 mg by mouth at bedtime.  Marland Kitchen atorvastatin (LIPITOR) 80 MG tablet Take 1 tablet (80 mg total) by mouth daily at 6 PM.  . BRILINTA 90 MG TABS tablet TAKE 1 TABLET BY MOUTH TWICE A DAY  . buPROPion (WELLBUTRIN XL) 150 MG 24 hr tablet Take 1 tablet (150 mg total) by mouth daily. (Patient taking differently: Take 150 mg by mouth at bedtime. )  . lisinopril (ZESTRIL) 20 MG tablet Take 1 tablet (20 mg total) by mouth daily.  . metoprolol tartrate (LOPRESSOR) 25 MG tablet Take 1 tablet (25 mg total) by mouth 2 (two) times daily.  . nitroGLYCERIN (NITROSTAT) 0.4 MG SL tablet Place 1 tablet (0.4 mg total) under the tongue every 5 (five) minutes x 3 doses as needed for chest pain.  Marland Kitchen omeprazole (PRILOSEC) 20 MG capsule Take 40 mg by mouth at bedtime.  . sildenafil (VIAGRA) 100 MG tablet Take 1 tablet  by mouth daily as needed.    Cardiac Studies:   Echocardiogram 06/26/2019:   1. Left ventricular ejection fraction, by visual estimation, is 55 to 60%. The left ventricle has normal function. There is no left ventricular hypertrophy. 2. Left ventricular diastolic function could not be evaluated pattern of LV diastolic filling. 3. Global right ventricle was not assessed.The right ventricular size is normal. No increase in right ventricular wall thickness. 4. Left atrial size was mildly dilated. 5. Mild mitral valve leaflet thickening with mild mitral regurgitation. 6. Normal right atrial pressure.  Coronary angiogram 06/26/2019: Normal LVEF at 65 to 70% with mild intraventricular pressure gradient at the apex, no pressure gradient across the aortic valve. Normal EDP. Mild disease in RCA and circumflex. LAD is diffusely diseased, mid segment has 99% stenosis, D1 has 40% stenosis. Successful stenting of mid LAD with implantation of 3.0 x 28 mm Synergy DES, postdilated with 3.25 x 15 mm [place] at 20 atmospheric pressure and distal and postdilated with 2.5 x 12 mm emerge [place] at 20 atmospheric pressure, overall stenosis reduced from 99% to 0% with TIMI-3 to TIMI-3 flow maintained. Patient has moderate diffuse disease in the mid to distal segment of the LAD and needs aggressive risk factor modification.  Assessment:   Atherosclerosis of native coronary artery of native heart without angina pectoris  Primary hypertension  Mixed hyperlipidemia  EKG 07/04/2019: Sinus bradycardia at 58 bpm, normal axis, no evidence of ischemia.   Recommendations:   Jon Shaw  is a 54 y.o. male  with hypertriglycerdemia, on 06/26/2019 with NSTEMI. Underwent coronary angiogram same day, found to have high-grade subtotal diffusely diseased LAD for which he underwent successful angioplasty and stenting with a DES. He was started on Lisinopril 10 mg at his last office visit and now presents for follow  up.  Patient is doing well  without any symptoms of angina. Blood pressure is well controlled. He is on appropriate medical therapy including DAPT and tolerating this well. He will need to continue with DAPT until October 2021 and then will continue with aspirin indefinitely.  He is due to have lipids performed for follow-up. He will have this performed in the next few days and I will notify him if there are any needed changes. Continue with Lipitor 80 mg daily. I have encouraged him to continue with regular exercise. I will plan to see him back in 6 months, but encouraged him to contact me sooner if needed.   Miquel Dunn, MSN, APRN, FNP-C [facility]. [place] Office: [phone] Fax: [phone]

## 2019-11-16 ENCOUNTER — Other Ambulatory Visit: Payer: Self-pay | Admitting: Cardiology

## 2020-02-05 DIAGNOSIS — I2510 Atherosclerotic heart disease of native coronary artery without angina pectoris: Secondary | ICD-10-CM | POA: Diagnosis not present

## 2020-02-06 ENCOUNTER — Encounter: Payer: Self-pay | Admitting: Cardiology

## 2020-02-06 ENCOUNTER — Other Ambulatory Visit: Payer: Self-pay

## 2020-02-06 ENCOUNTER — Ambulatory Visit: Payer: BC Managed Care – PPO | Admitting: Cardiology

## 2020-02-06 VITALS — BP 134/79 | HR 55 | Resp 17 | Ht 75.0 in | Wt 195.0 lb

## 2020-02-06 DIAGNOSIS — E782 Mixed hyperlipidemia: Secondary | ICD-10-CM

## 2020-02-06 DIAGNOSIS — I10 Essential (primary) hypertension: Secondary | ICD-10-CM

## 2020-02-06 DIAGNOSIS — I25118 Atherosclerotic heart disease of native coronary artery with other forms of angina pectoris: Secondary | ICD-10-CM

## 2020-02-06 LAB — LIPID PANEL
Chol/HDL Ratio: 3.7 ratio (ref 0.0–5.0)
Cholesterol, Total: 107 mg/dL (ref 100–199)
HDL: 29 mg/dL — ABNORMAL LOW (ref >39)
LDL Chol Calc (NIH): 57 mg/dL (ref 0–99)
Triglycerides: 112 mg/dL (ref 0–149)
VLDL Cholesterol Cal: 21 mg/dL (ref 5–40)

## 2020-02-06 LAB — COMPREHENSIVE METABOLIC PANEL
ALT: 49 IU/L — ABNORMAL HIGH (ref 0–44)
AST: 24 IU/L (ref 0–40)
Albumin/Globulin Ratio: 2.1 (ref 1.2–2.2)
Albumin: 4.7 g/dL (ref 3.8–4.9)
Alkaline Phosphatase: 93 IU/L (ref 48–121)
BUN/Creatinine Ratio: 15 (ref 9–20)
BUN: 19 mg/dL (ref 6–24)
Bilirubin Total: 0.5 mg/dL (ref 0.0–1.2)
CO2: 21 mmol/L (ref 20–29)
Calcium: 9.5 mg/dL (ref 8.7–10.2)
Chloride: 105 mmol/L (ref 96–106)
Creatinine, Ser: 1.24 mg/dL (ref 0.76–1.27)
GFR calc Af Amer: 77 mL/min/{1.73_m2} (ref >59)
GFR calc non Af Amer: 66 mL/min/{1.73_m2} (ref >59)
Globulin, Total: 2.2 g/dL (ref 1.5–4.5)
Glucose: 92 mg/dL (ref 65–99)
Potassium: 4.4 mmol/L (ref 3.5–5.2)
Sodium: 141 mmol/L (ref 134–144)
Total Protein: 6.9 g/dL (ref 6.0–8.5)

## 2020-02-06 LAB — CBC
Hematocrit: 47.9 % (ref 37.5–51.0)
Hemoglobin: 15.7 g/dL (ref 13.0–17.7)
MCH: 29.5 pg (ref 26.6–33.0)
MCHC: 32.8 g/dL (ref 31.5–35.7)
MCV: 90 fL (ref 79–97)
Platelets: 260 10*3/uL (ref 150–450)
RBC: 5.33 x10E6/uL (ref 4.14–5.80)
RDW: 12.4 % (ref 11.6–15.4)
WBC: 6.7 10*3/uL (ref 3.4–10.8)

## 2020-02-06 NOTE — Progress Notes (Signed)
Labs look okay, your patient

## 2020-02-06 NOTE — Progress Notes (Signed)
Follow up visit  Subjective:   Jon Shaw, male    DOB: Jan 29, 1966, 54 y.o.   MRN: [id number]   HPI   Chief Complaint  Patient presents with  . Chest Pain  . Follow-up    54 y.o. Caucasian male with hypertension, CAD, s/p mid LAD PCI 06/2019.  Patient has been doing well since his PCI.  For the last few days, he has had constant left-sided chest and back pain.  Pain is not worse with exertion.  There are no specific aggravating or relieving factors noted.  He has noticed decrease in his overall energy level and motivation.  He was able to walk 3 miles with his wife after his PCI, but has not done so recently due to the above reasons.  Today, patient asked me if he thinks patient needs disability.  Current Outpatient Medications on File Prior to Visit  Medication Sig Dispense Refill  . aspirin EC 81 MG tablet Take 81 mg by mouth at bedtime.    Marland Kitchen atorvastatin (LIPITOR) 80 MG tablet Take 1 tablet (80 mg total) by mouth daily at 6 PM. 30 tablet 2  . BRILINTA 90 MG TABS tablet TAKE 1 TABLET BY MOUTH TWICE A DAY 60 tablet 0  . buPROPion (WELLBUTRIN XL) 150 MG 24 hr tablet Take 1 tablet (150 mg total) by mouth daily. (Patient taking differently: Take 150 mg by mouth at bedtime. ) 30 tablet 0  . lisinopril (ZESTRIL) 20 MG tablet Take 1 tablet (20 mg total) by mouth daily. 90 tablet 3  . metoprolol tartrate (LOPRESSOR) 25 MG tablet TAKE 1 TABLET BY MOUTH TWICE A DAY 60 tablet 1  . nitroGLYCERIN (NITROSTAT) 0.4 MG SL tablet Place 1 tablet (0.4 mg total) under the tongue every 5 (five) minutes x 3 doses as needed for chest pain. 25 tablet 3  . omeprazole (PRILOSEC) 20 MG capsule Take 40 mg by mouth at bedtime.    . sildenafil (VIAGRA) 100 MG tablet Take 1 tablet by mouth daily as needed.     No current facility-administered medications on file prior to visit.    Cardiovascular & other pertient studies:  EKG 02/06/2020: Sinus rhythm 55 bpm. Normal EKG.  Echocardiogram 06/26/2019:    1. Left ventricular ejection fraction, by visual estimation, is 55 to 60%. The left ventricle has normal function. There is no left ventricular hypertrophy. 2. Left ventricular diastolic function could not be evaluated pattern of LV diastolic filling. 3. Global right ventricle was not assessed.The right ventricular size is normal. No increase in right ventricular wall thickness. 4. Left atrial size was mildly dilated. 5. Mild mitral valve leaflet thickening with mild mitral regurgitation. 6. Normal right atrial pressure.  Coronary angiogram/intervention 06/26/2019: LM: Normal LAD: Mid 99% stenosis, followed diffuse disease          Successful stenting of mid LAD with implantation of 3.0 x 28 mm Synergy DES, postdilated with 3.25 x 15 mm [place] at 20 atmospheric pressure and distal and postdilated with 2.5 x 12 mm emerge [place] at 20 atmospheric pressure, Stenosis reduced from 99% to 0% with TIMI-3 to TIMI-3 flow maintained. LCx: Normal RCA: Normal  Recent labs: 02/05/2020: Glucose 92, BUN/Cr 19/1.24. EGFR 66. Na/K 141/4.4. Rest of the CMP normal H/H 15.7/47.9. MCV 90. Platelets 260 Chol 107, TG 112, HDL 29, LDL 57  02/2010: HbA1C 5.7% TSH1.39 normal   Review of Systems  Cardiovascular: Negative for chest pain, dyspnea on exertion, leg swelling, palpitations and syncope.  Vitals:   02/06/20 1404  BP: 134/79  Pulse: (!) 55  Resp: 17  SpO2: 99%    Body mass index is 24.37 kg/m. Filed Weights   02/06/20 1404  Weight: 195 lb (88.5 kg)     Objective:   Physical Exam  Constitutional: No distress.  Neck: No JVD present.  Cardiovascular: Normal rate, regular rhythm, normal heart sounds and intact distal pulses.  No murmur heard. Pulmonary/Chest: Effort normal and breath sounds normal. He has no wheezes. He has no rales.  Musculoskeletal:        General: No edema.  Nursing note and vitals reviewed.         Assessment & Recommendations:   54 y.o.  Caucasian male with hypertension, CAD, s/p mid LAD PCI 06/2019.  CAD: S/p mid LAD PCI in 2020, with residual moderate disease in LAD.  His current symptoms are not suggestive of angina.  I suspect chest pain is musculoskeletal in etiology. Resting EKG is normal. However, given his CAD history, recommend exercise nuclear stress test for stratification.  If stress test does not show significant ischemia, I do not think he needs disability from coronary artery disease standpoint.  I am concerned that he may have other causes for his lack of energy and motivation, such as hypothyroidism or depression.  I will defer work-up or management to PCP.  I will see him back in 6 months   Jon Shaw Esther Hardy, MD [facility] Pager: [phone] Office: [phone]

## 2020-02-25 ENCOUNTER — Other Ambulatory Visit: Payer: Self-pay

## 2020-02-25 ENCOUNTER — Ambulatory Visit: Payer: BC Managed Care – PPO

## 2020-02-25 DIAGNOSIS — I25118 Atherosclerotic heart disease of native coronary artery with other forms of angina pectoris: Secondary | ICD-10-CM

## 2020-02-28 ENCOUNTER — Telehealth: Payer: Self-pay

## 2020-02-28 NOTE — Telephone Encounter (Signed)
Informed patient that we are unable to support disability claim. Patient verbalized understanding.

## 2020-02-28 NOTE — Telephone Encounter (Signed)
I am sorry. I am unable to support disability claim. From cardiac standpoint, I do not think he needs disability.  Thanks MJP

## 2020-02-28 NOTE — Telephone Encounter (Signed)
Discussed results with patient and recommendation of cutting statin in half. Patient says he doesn't think this the problem. He says he can not work or only work part time. Patient states he will drop off disability paper work for you to fill out. I informed him of your recommendations in your last office note concerning disability. He states that he disagrees with this and would like to speak with you further concerning this.

## 2020-03-05 DIAGNOSIS — F322 Major depressive disorder, single episode, severe without psychotic features: Secondary | ICD-10-CM | POA: Diagnosis not present

## 2020-03-05 DIAGNOSIS — I25119 Atherosclerotic heart disease of native coronary artery with unspecified angina pectoris: Secondary | ICD-10-CM | POA: Diagnosis not present

## 2020-03-05 DIAGNOSIS — I2510 Atherosclerotic heart disease of native coronary artery without angina pectoris: Secondary | ICD-10-CM | POA: Diagnosis not present

## 2020-03-05 DIAGNOSIS — K2100 Gastro-esophageal reflux disease with esophagitis, without bleeding: Secondary | ICD-10-CM | POA: Diagnosis not present

## 2020-03-05 DIAGNOSIS — Z0000 Encounter for general adult medical examination without abnormal findings: Secondary | ICD-10-CM | POA: Diagnosis not present

## 2020-03-07 ENCOUNTER — Encounter: Payer: Self-pay | Admitting: Internal Medicine

## 2020-03-07 ENCOUNTER — Other Ambulatory Visit: Payer: Self-pay

## 2020-03-07 ENCOUNTER — Ambulatory Visit (INDEPENDENT_AMBULATORY_CARE_PROVIDER_SITE_OTHER): Payer: BC Managed Care – PPO | Admitting: Internal Medicine

## 2020-03-07 VITALS — BP 118/82 | HR 55 | Ht 74.0 in | Wt 197.0 lb

## 2020-03-07 DIAGNOSIS — I2510 Atherosclerotic heart disease of native coronary artery without angina pectoris: Secondary | ICD-10-CM

## 2020-03-07 DIAGNOSIS — E782 Mixed hyperlipidemia: Secondary | ICD-10-CM

## 2020-03-07 DIAGNOSIS — I10 Essential (primary) hypertension: Secondary | ICD-10-CM

## 2020-03-07 NOTE — Patient Instructions (Signed)
Medication Instructions:  Your physician recommends that you continue on your current medications as directed. Please refer to the Current Medication list given to you today.  *If you need a refill on your cardiac medications before your next appointment, please call your pharmacy*   Lab Work: None today If you have labs (blood work) drawn today and your tests are completely normal, you will receive your results only by: . MyChart Message (if you have MyChart) OR . A paper copy in the mail If you have any lab test that is abnormal or we need to change your treatment, we will call you to review the results.   Testing/Procedures: None today   Follow-Up: At [facility], you and your health needs are our priority.  As part of our continuing mission to provide you with exceptional heart care, we have created designated Provider Care Teams.  These Care Teams include your primary Cardiologist (physician) and Advanced Practice Providers (APPs -  Physician Assistants and Nurse Practitioners) who all work together to provide you with the care you need, when you need it.  We recommend signing up for the patient portal called "MyChart".  Sign up information is provided on this After Visit Summary.  MyChart is used to connect with patients for Virtual Visits (Telemedicine).  Patients are able to view lab/test results, encounter notes, upcoming appointments, etc.  Non-urgent messages can be sent to your provider as well.   To learn more about what you can do with MyChart, go to [email or web address].    Your next appointment:   3 month(s)  The format for your next appointment:   In Person  Provider:   Jonathan Branch, MD   Other Instructions None      Thank you for choosing [facility] !         

## 2020-03-07 NOTE — Progress Notes (Signed)
Cardiology Office Note   Date:  03/07/2020   ID:  Jon Shaw, DOB Feb 01, 1966, MRN [id number]  PCP:  Leeanne Rio, MD  Cardiologist:   Dorris Carnes, MD   Patient referred by  A Rucker's office for continued evaluation of CAD     History of Present Illness: Jon Shaw is a 54 y.o. male with a history of CAD  The patient was previously followed by Wallie Char The pt had a heart cath in 2011 that reportd showed no signif CAD.  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  Describes elephant sensation in chest   He was admitted to [facility] with EKG changes  Trop only 24.  He underwent L heart cath which showed:  99% LAD   No significant dz [place]  He underwent PTCA/DES to this region  Post procedure he was seen at Dr Nanci Pina office by APP   In spring he complained of some CP   This was different from discomfort in OCtober however   He underwent Lexiscan myovue on June 8   Minimal decrease counts noted in basal inferolateral wall   Probable technique  Low risk  LVEF 58%     The pt wants to find another cardiologist    Lives in [place]  Does complain of some decreased exercise tolerance    Not like he was when he was younger   No abrupt change   He is walking 3 miles per day         Current Meds  Medication Sig  . aspirin EC 81 MG tablet Take 81 mg by mouth at bedtime.  Marland Kitchen atorvastatin (LIPITOR) 80 MG tablet Take 1 tablet (80 mg total) by mouth daily at 6 PM.  . BRILINTA 90 MG TABS tablet TAKE 1 TABLET BY MOUTH TWICE A DAY  . buPROPion (WELLBUTRIN XL) 150 MG 24 hr tablet Take 1 tablet (150 mg total) by mouth daily. (Patient taking differently: Take 150 mg by mouth at bedtime. )  . lisinopril (ZESTRIL) 20 MG tablet Take 1 tablet (20 mg total) by mouth daily.  . metoprolol tartrate (LOPRESSOR) 25 MG tablet TAKE 1 TABLET BY MOUTH TWICE A DAY  . nitroGLYCERIN (NITROSTAT) 0.4 MG SL tablet Place 1 tablet  (0.4 mg total) under the tongue every 5 (five) minutes x 3 doses as needed for chest pain.  Marland Kitchen omeprazole (PRILOSEC) 20 MG capsule Take 40 mg by mouth at bedtime.  . sildenafil (VIAGRA) 100 MG tablet Take 1 tablet by mouth daily as needed.     Allergies:   Sulfonamide derivatives   Past Medical History:  Diagnosis Date  . Chest tightness    troponin elevation 0.4. cath [facility] 03/03/10 - normal  . Coronary artery disease   . Depression   . GERD (gastroesophageal reflux disease)   . History of cardiac catheterization    EF 60% 6/11  . Hypertension   . Hypertriglyceridemia     Past Surgical History:  Procedure Laterality Date  . CARDIAC CATHETERIZATION    . CORONARY STENT INTERVENTION N/A 06/26/2019   Procedure: CORONARY STENT INTERVENTION;  Surgeon: Adrian Prows, MD;  Location: [place] [facility];  Service: Cardiovascular;  Laterality: N/A;  . LEFT HEART CATH AND CORONARY ANGIOGRAPHY N/A 06/26/2019   Procedure: LEFT HEART CATH AND CORONARY ANGIOGRAPHY;  Surgeon: Adrian Prows, MD;  Location: [place] [facility];  Service:  Cardiovascular;  Laterality: N/A;     Social History:  The patient  reports that he has never smoked. He has never used smokeless tobacco. He reports that he does not drink alcohol and does not use drugs.   Family History:  The patient's family history includes Heart attack (age of onset: 31) in his father.    ROS:  Please see the history of present illness. All other systems are reviewed and  Negative to the above problem except as noted.    PHYSICAL EXAM: VS:  BP 118/82 (BP Location: Left Arm)   Pulse (!) 55   Ht 6\' 2"  (1.88 m)   Wt 197 lb (89.4 kg)   SpO2 97%   BMI 25.29 kg/m   GEN: Well nourished, well developed, in no acute distress  HEENT: normal  Neck: no JVD, carotid bruits Cardiac: RRR; no murmurs, rubs, or gallops,no LE  edema  Respiratory:  clear to auscultation bilaterally, normal work of breathing GI: soft, nontender, nondistended, + BS  No  hepatomegaly  MS: no deformity Moving all extremities   Skin: warm and dry, no rash Neuro:  Strength and sensation are intact Psych: euthymic mood, full affect   EKG:  EKG is not ordered today.  Recent myovue   Lipid Panel    Component Value Date/Time   CHOL 107 02/05/2020 1106   TRIG 112 02/05/2020 1106   HDL 29 (L) 02/05/2020 1106   CHOLHDL 3.7 02/05/2020 1106   CHOLHDL 5.2 06/26/2019 0843   VLDL 19 06/26/2019 0843   LDLCALC 57 02/05/2020 1106      Wt Readings from Last 3 Encounters:  03/07/20 197 lb (89.4 kg)  02/06/20 195 lb (88.5 kg)  11/15/19 198 lb (89.8 kg)      ASSESSMENT AND PLAN:  1  CAD  Pt with intervention in Falll 2020 with stent to LAD (99 to 0)  No other obstructive dz    On DAPT Recently had some discomfort   Different in quality for October's symptoms however   Description of results appears to be low risk, probably diaphragm I reassured patient   I would keep on medical Rx  Set follow up in the fall with J Branch   He would like to set up new provider in [place].   Decision at that time for long term antiplatelet Rx  2  HTN  Good control on current regimen   COntinue  3  HL  LIpids in May   LDL 57   HDL 29   Stay active   Watch diet   Keep on statin      Current medicines are reviewed at length with the patient today.  The patient does not have concerns regarding medicines.  Signed, June, MD  03/07/2020 11:18 AM    [facility] [address] [place], [place], [place]  [place] Phone: [phone]; Fax: [phone]

## 2020-05-07 DIAGNOSIS — Z6824 Body mass index (BMI) 24.0-24.9, adult: Secondary | ICD-10-CM | POA: Diagnosis not present

## 2020-05-07 DIAGNOSIS — R21 Rash and other nonspecific skin eruption: Secondary | ICD-10-CM | POA: Diagnosis not present

## 2020-05-08 ENCOUNTER — Other Ambulatory Visit: Payer: Self-pay

## 2020-05-08 ENCOUNTER — Telehealth: Payer: Self-pay

## 2020-05-08 ENCOUNTER — Encounter: Payer: Self-pay | Admitting: Student

## 2020-05-08 ENCOUNTER — Ambulatory Visit: Payer: BC Managed Care – PPO | Admitting: Student

## 2020-05-08 VITALS — BP 112/68 | HR 68 | Ht 75.0 in | Wt 197.0 lb

## 2020-05-08 DIAGNOSIS — M79602 Pain in left arm: Secondary | ICD-10-CM | POA: Diagnosis not present

## 2020-05-08 DIAGNOSIS — I10 Essential (primary) hypertension: Secondary | ICD-10-CM

## 2020-05-08 DIAGNOSIS — I2510 Atherosclerotic heart disease of native coronary artery without angina pectoris: Secondary | ICD-10-CM | POA: Diagnosis not present

## 2020-05-08 DIAGNOSIS — E785 Hyperlipidemia, unspecified: Secondary | ICD-10-CM

## 2020-05-08 DIAGNOSIS — M79601 Pain in right arm: Secondary | ICD-10-CM

## 2020-05-08 DIAGNOSIS — Z79899 Other long term (current) drug therapy: Secondary | ICD-10-CM | POA: Diagnosis not present

## 2020-05-08 MED ORDER — METOPROLOL TARTRATE 25 MG PO TABS: 12.5000 mg | ORAL_TABLET | Freq: Two times a day (BID) | ORAL | 3 refills | Status: DC

## 2020-05-08 NOTE — Progress Notes (Addendum)
Cardiology Office Note    Date:  05/08/2020   ID:  Jon Shaw, DOB [birth year], MRN [id number]  PCP:  Suzan Slick, MD  Cardiologist: Previously Dr. Rosemary Holms --> Switched to Dr. Tenny Craw in 02/2020 --> Has scheduled follow-up with Dr. Wyline Mood in 06/2020 as he wished to switch to the [facility] office  Chief Complaint  Patient presents with  . Follow-up    Bilateral arm pain    History of Present Illness:    Jon Shaw is a 54 y.o. male with past medical history of CAD (s/p cath in 06/2019 with DES to mid-LAD and residual 40% D1 stenosis and 40% distal LAD stenosis with medical management recommended), HTN and HLD who presents to the office today for evaluation of arm pain.   He was examined by Dr. Tenny Craw in 02/2020 as he wished to switch from Dr. Damian Leavell office to [facility] for closer care. He had recently underwent a Lexiscan Myoview earlier that month due to intermittent episodes of chest discomfort and this had shown no definitive ischemia and was overall a low risk study. At the time of his visit, he was walking up to 3 miles daily without anginal symptoms. He was continued on his current medication regimen at that time including ASA 81 mg daily, Atorvastatin 80 mg daily, Brilinta 90 mg twice daily, Lisinopril 20 mg daily and Lopressor 25 mg twice daily.  In talking with the patient today, he reports developing pain along his forearms bilaterally which occurred yesterday evening. Symptoms lasted for over 4 hours and were not associated with exertion or positional changes. He reports this felt as a cramping sensation along his forearms. He was started on Vistaril and Prednisone yesterday for poison ivy. He denies any associated chest pain or dyspnea on exertion. Symptoms do not resemble his prior angina. Feels back to baseline today.  He actually ran a 5K this weekend and denies any anginal symptoms at that time. Has noticed intermittent fatigue since his stent placement and  questions if this is due to his medications. No recent orthopnea, PND or lower extremity edema.  Past Medical History:  Diagnosis Date  . Coronary artery disease    a. s/p cath in 06/2019 with DES to mid-LAD and residual 40% D1 stenosis and 40% distal LAD stenosis with medical management recommended  . Depression   . GERD (gastroesophageal reflux disease)   . History of cardiac catheterization    EF 60% 6/11  . Hypertension   . Hypertriglyceridemia     Past Surgical History:  Procedure Laterality Date  . CARDIAC CATHETERIZATION    . CORONARY STENT INTERVENTION N/A 06/26/2019   Procedure: CORONARY STENT INTERVENTION;  Surgeon: Yates Decamp, MD;  Location: [facility] INVASIVE CV LAB;  Service: Cardiovascular;  Laterality: N/A;  . LEFT HEART CATH AND CORONARY ANGIOGRAPHY N/A 06/26/2019   Procedure: LEFT HEART CATH AND CORONARY ANGIOGRAPHY;  Surgeon: Yates Decamp, MD;  Location: [facility] INVASIVE CV LAB;  Service: Cardiovascular;  Laterality: N/A;    Current Medications: Outpatient Medications Prior to Visit  Medication Sig Dispense Refill  . aspirin EC 81 MG tablet Take 81 mg by mouth at bedtime.    Marland Kitchen atorvastatin (LIPITOR) 80 MG tablet Take 1 tablet (80 mg total) by mouth daily at 6 PM. 30 tablet 2  . BRILINTA 90 MG TABS tablet TAKE 1 TABLET BY MOUTH TWICE A DAY 60 tablet 0  . buPROPion (WELLBUTRIN XL) 150 MG 24 hr tablet Take 1 tablet (150 mg total) by mouth  daily. (Patient taking differently: Take 150 mg by mouth at bedtime. ) 30 tablet 0  . hydrOXYzine (VISTARIL) 25 MG capsule Take by mouth.    Marland Kitchen. lisinopril (ZESTRIL) 20 MG tablet Take 1 tablet (20 mg total) by mouth daily. 90 tablet 3  . nitroGLYCERIN (NITROSTAT) 0.4 MG SL tablet Place 1 tablet (0.4 mg total) under the tongue every 5 (five) minutes x 3 doses as needed for chest pain. 25 tablet 3  . omeprazole (PRILOSEC) 20 MG capsule Take 40 mg by mouth at bedtime.    . predniSONE (DELTASONE) 10 MG tablet Take by mouth.    . sildenafil (VIAGRA) 100  MG tablet Take 1 tablet by mouth daily as needed.    . metoprolol tartrate (LOPRESSOR) 25 MG tablet TAKE 1 TABLET BY MOUTH TWICE A DAY 60 tablet 1   No facility-administered medications prior to visit.     Allergies:   Sulfonamide derivatives   Social History   Socioeconomic History  . Marital status: Married    Spouse name: Not on file  . Number of children: 2  . Years of education: Not on file  . Highest education level: Not on file  Occupational History  . Not on file  Tobacco Use  . Smoking status: Never Smoker  . Smokeless tobacco: Never Used  Vaping Use  . Vaping Use: Never used  Substance and Sexual Activity  . Alcohol use: No  . Drug use: No  . Sexual activity: Never  Other Topics Concern  . Not on file  Social History Narrative   Married, 2 children.   [employer] but currently out of work.    Social Determinants of Health   Financial Resource Strain:   . Difficulty of Paying Living Expenses: Not on file  Food Insecurity:   . Worried About Programme researcher, broadcasting/film/videounning Out of Food in the Last Year: Not on file  . Ran Out of Food in the Last Year: Not on file  Transportation Needs:   . Lack of Transportation (Medical): Not on file  . Lack of Transportation (Non-Medical): Not on file  Physical Activity:   . Days of Exercise per Week: Not on file  . Minutes of Exercise per Session: Not on file  Stress:   . Feeling of Stress : Not on file  Social Connections:   . Frequency of Communication with Friends and Family: Not on file  . Frequency of Social Gatherings with Friends and Family: Not on file  . Attends Religious Services: Not on file  . Active Member of Clubs or Organizations: Not on file  . Attends BankerClub or Organization Meetings: Not on file  . Marital Status: Not on file     Family History:  The patient's family history includes Heart attack (age of onset: 8246) in his father.   Review of Systems:   Please see the history of present illness.     General:  No  chills, fever, night sweats or weight changes. Positive for arm pain.  Cardiovascular:  No chest pain, dyspnea on exertion, edema, orthopnea, palpitations, paroxysmal nocturnal dyspnea. Dermatological: No rash, lesions/masses Respiratory: No cough, dyspnea Urologic: No hematuria, dysuria Abdominal:   No nausea, vomiting, diarrhea, bright red blood per rectum, melena, or hematemesis Neurologic:  No visual changes, wkns, changes in mental status. All other systems reviewed and are otherwise negative except as noted above.   Physical Exam:    VS:  BP 112/68   Pulse 68   Ht 6\' 3"  (1.905 m)  Wt 197 lb (89.4 kg)   SpO2 97%   BMI 24.62 kg/m    General: Well developed, well nourished,male appearing in no acute distress. Head: Normocephalic, atraumatic. Neck: No carotid bruits. JVD not elevated.  Lungs: Respirations regular and unlabored, without wheezes or rales.  Heart: Regular rate and rhythm. No S3 or S4.  No murmur, no rubs, or gallops appreciated. Abdomen: Appears non-distended. No obvious abdominal masses. Msk:  Strength and tone appear normal for age. No obvious joint deformities or effusions. Extremities: No clubbing or cyanosis. No lower extremity edema.  Distal pedal pulses are 2+ bilaterally. Radial pulses 2+.  Neuro: Alert and oriented X 3. Moves all extremities spontaneously. No focal deficits noted. Psych:  Responds to questions appropriately with a normal affect. Skin: No rashes or lesions noted  Wt Readings from Last 3 Encounters:  05/08/20 197 lb (89.4 kg)  03/07/20 197 lb (89.4 kg)  02/06/20 195 lb (88.5 kg)     Studies/Labs Reviewed:   EKG:  EKG is ordered today.  The ekg ordered today demonstrates NSR, HR 64 with no acute ST abnormalities when compared to prior tracings.   Recent Labs: 02/05/2020: ALT 49; BUN 19; Creatinine, Ser 1.24; Hemoglobin 15.7; Platelets 260; Potassium 4.4; Sodium 141   Lipid Panel    Component Value Date/Time   CHOL 107 02/05/2020  1106   TRIG 112 02/05/2020 1106   HDL 29 (L) 02/05/2020 1106   CHOLHDL 3.7 02/05/2020 1106   CHOLHDL 5.2 06/26/2019 0843   VLDL 19 06/26/2019 0843   LDLCALC 57 02/05/2020 1106    Additional studies/ records that were reviewed today include:   Cardiac Catheterization: 06/2019 Coronary angiogram 06/26/2019: Normal LVEF at 65 to 70% with mild intraventricular pressure gradient at the apex, no pressure gradient across the aortic valve.  Normal EDP. Mild disease in RCA and circumflex.  LAD is diffusely diseased, mid segment has 99% stenosis, D1 has 40% stenosis.  Successful stenting of mid LAD with implantation of 3.0 x 28 mm Synergy DES, postdilated with 3.25 x 15 mm Sapphire Xenia at 20 atmospheric pressure and distal and postdilated with 2.5 x 12 mm emerge [place] at 20 atmospheric pressure, overall stenosis reduced from 99% to 0% with TIMI-3 to TIMI-3 flow maintained. Patient has moderate diffuse disease in the mid to distal segment of the LAD and needs aggressive risk factor modification.  Recommendation: Patient will be discharged tomorrow if he remains stable with dual antiplatelet therapy including aspirin indefinitely and Brilinta at least for a period of 1 year.    Echocardiogram: 06/2019 IMPRESSIONS    1. Left ventricular ejection fraction, by visual estimation, is 55 to  60%. The left ventricle has normal function. There is no left ventricular  hypertrophy.  2. Left ventricular diastolic function could not be evaluated pattern of  LV diastolic filling.  3. Global right ventricle was not assessed.The right ventricular size is  normal. No increase in right ventricular wall thickness.  4. Left atrial size was mildly dilated.  5. Mild mitral valve leaflet thickening with mild mitral regurgitation.  6. Normal right atrial pressure.    Lexiscan Myoview: 02/2020 Lexiscan (Walking with Camelia Phenes) Sestamibi Stress Test 02/26/2020: Minimal decrease in counts in basal inferolateral  region. Subtle very small sized inferolateral ischemia cannot be excluded. Defect may probably related to technique.  Overall LV systolic function is normal without regional wall motion abnormalities. Stress LV EF: 58%.  No previous exam available for comparison. Low risk study.  Assessment:    1. Pain in both upper extremities   2. Coronary artery disease involving native coronary artery of native heart without angina pectoris   3. Medication management   4. Essential hypertension   5. Hyperlipidemia LDL goal <70      Plan:   In order of problems listed above:  1. Bilateral Arm Pain - He developed a tightness/cramping sensation along his forearms bilaterally yesterday evening which lasted for over 4 hours and spontaneously resolved. Symptoms were not worse with exertion or positional changes. No recurrent symptoms this morning. He has been walking for several miles at a time without anginal symptoms. Overall, his arm pain seems atypical for angina and likely more MSK pain. His EKG today shows no acute ST changes. He was started on Vistaril and Prednisone yesterday as outlined above and it is unclear if this contributed to his symptoms. He is concerned he might have been dehydrated and I recommended that we check a BMET to reassess kidney function and electrolytes. He wishes to hold off on this for now unless he has recurrent symptoms but was provided with a lab slip today.   2. CAD - He is s/p cath in 06/2019 with DES to mid-LAD and residual 40% D1 stenosis and 40% distal LAD stenosis with medical management recommended of residual disease as outlined above. He did have a stress test in 02/2020 which was low risk as outlined above. - His EKG today shows no acute ST changes and his bilateral arm pain yesterday seems atypical for angina. - Continue ASA 81 mg daily, Brilinta 90 mg twice daily and Atorvastatin 80 mg daily. Will reduce Lopressor to 12.5 mg twice daily to see if this helps  with fatigue.  3. HTN - BP is well controlled at 112/68 during today's visit. He does report fatigue since being started on beta-blocker therapy last year and is currently on Lopressor 25 mg twice daily. Will reduce this to 12.5 mg twice daily to see if this helps with his symptoms. Continue Lisinopril 20 mg daily.   4. HLD - FLP in 01/2020 showed total cholesterol 107, triglycerides 112, HDL 29 and LDL 57.  At goal of LDL less than 70. Continue current medication regimen with Atorvastatin 80 mg daily.   Will keep his previously scheduled visit with Dr. Wyline Mood in 06/2020.   Medication Adjustments/Labs and Tests Ordered: Current medicines are reviewed at length with the patient today.  Concerns regarding medicines are outlined above.  Medication changes, Labs and Tests ordered today are listed in the Patient Instructions below. Patient Instructions  Medication Instructions:  Your physician has recommended you make the following change in your medication:  Decrease Lopressor to 12.5 mg Two Times Daily   *If you need a refill on your cardiac medications before your next appointment, please call your pharmacy*   Lab Work: Your physician recommends that you return for lab work in: Today   If you have labs (blood work) drawn today and your tests are completely normal, you will receive your results only by: Marland Kitchen MyChart Message (if you have MyChart) OR . A paper copy in the mail If you have any lab test that is abnormal or we need to change your treatment, we will call you to review the results.   Testing/Procedures: NONE    Follow-Up: At [facility], you and your health needs are our priority.  As part of our continuing mission to provide you with exceptional heart care, we have created designated  Provider Care Teams.  These Care Teams include your primary Cardiologist (physician) and Advanced Practice Providers (APPs -  Physician Assistants and Nurse Practitioners) who all work  together to provide you with the care you need, when you need it.  We recommend signing up for the patient portal called "MyChart".  Sign up information is provided on this After Visit Summary.  MyChart is used to connect with patients for Virtual Visits (Telemedicine).  Patients are able to view lab/test results, encounter notes, upcoming appointments, etc.  Non-urgent messages can be sent to your provider as well.   To learn more about what you can do with MyChart, go to [email or web address].    Your next appointment:   2 month(s)  The format for your next appointment:   In Person  Provider:   Dina Rich, MD   Other Instructions Thank you for choosing [facility]!    Signed, Ellsworth Lennox, PA-C  05/08/2020 4:33 PM    [facility] [address] [place], [place] [address] Phone: [phone] Fax: [phone]

## 2020-05-08 NOTE — Patient Instructions (Signed)
Medication Instructions:  Your physician has recommended you make the following change in your medication:  Decrease Lopressor to 12.5 mg Two Times Daily   *If you need a refill on your cardiac medications before your next appointment, please call your pharmacy*   Lab Work: Your physician recommends that you return for lab work in: Today   If you have labs (blood work) drawn today and your tests are completely normal, you will receive your results only by: Marland Kitchen MyChart Message (if you have MyChart) OR . A paper copy in the mail If you have any lab test that is abnormal or we need to change your treatment, we will call you to review the results.   Testing/Procedures: NONE    Follow-Up: At [facility], you and your health needs are our priority.  As part of our continuing mission to provide you with exceptional heart care, we have created designated Provider Care Teams.  These Care Teams include your primary Cardiologist (physician) and Advanced Practice Providers (APPs -  Physician Assistants and Nurse Practitioners) who all work together to provide you with the care you need, when you need it.  We recommend signing up for the patient portal called "MyChart".  Sign up information is provided on this After Visit Summary.  MyChart is used to connect with patients for Virtual Visits (Telemedicine).  Patients are able to view lab/test results, encounter notes, upcoming appointments, etc.  Non-urgent messages can be sent to your provider as well.   To learn more about what you can do with MyChart, go to [email or web address].    Your next appointment:   2 month(s)  The format for your next appointment:   In Person  Provider:   Dina Rich, MD   Other Instructions Thank you for choosing [facility]!

## 2020-05-08 NOTE — Telephone Encounter (Signed)
I spoke with the patient last night and recommended ED evaluation for 15 min duration of bilateral squeezing arm pain. I suggested that is he does not got to ED, he can be seen in the office-possible next day. However, later on I realized that patient has switched his cardiac care to Dr. Tenny Craw with [facility]. Therefore, I recommend follow up with Dr. Tenny Craw' office.

## 2020-05-14 ENCOUNTER — Ambulatory Visit: Payer: BC Managed Care – PPO | Admitting: Cardiology

## 2020-05-27 DIAGNOSIS — M25561 Pain in right knee: Secondary | ICD-10-CM | POA: Diagnosis not present

## 2020-06-16 ENCOUNTER — Ambulatory Visit: Payer: BC Managed Care – PPO | Admitting: Cardiology

## 2020-06-23 DIAGNOSIS — M25561 Pain in right knee: Secondary | ICD-10-CM | POA: Diagnosis not present

## 2020-06-27 DIAGNOSIS — M25561 Pain in right knee: Secondary | ICD-10-CM | POA: Diagnosis not present

## 2020-06-30 DIAGNOSIS — M25561 Pain in right knee: Secondary | ICD-10-CM | POA: Diagnosis not present

## 2020-07-01 ENCOUNTER — Encounter: Payer: Self-pay | Admitting: Family Medicine

## 2020-07-01 ENCOUNTER — Ambulatory Visit: Payer: BC Managed Care – PPO | Admitting: Family Medicine

## 2020-07-01 VITALS — BP 118/76 | HR 60 | Ht 75.0 in | Wt 196.4 lb

## 2020-07-01 DIAGNOSIS — E782 Mixed hyperlipidemia: Secondary | ICD-10-CM

## 2020-07-01 DIAGNOSIS — I2510 Atherosclerotic heart disease of native coronary artery without angina pectoris: Secondary | ICD-10-CM | POA: Diagnosis not present

## 2020-07-01 DIAGNOSIS — M79601 Pain in right arm: Secondary | ICD-10-CM | POA: Diagnosis not present

## 2020-07-01 DIAGNOSIS — M79602 Pain in left arm: Secondary | ICD-10-CM

## 2020-07-01 DIAGNOSIS — I10 Essential (primary) hypertension: Secondary | ICD-10-CM

## 2020-07-01 NOTE — Patient Instructions (Signed)
Medication Instructions:  ?Stop Brilinta  ?Continue all other medications.    ? ?Labwork: ?none ? ?Testing/Procedures: ?none ? ?Follow-Up: ?6 months  ? ?Any Other Special Instructions Will Be Listed Below (If Applicable). ? ? ?If you need a refill on your cardiac medications before your next appointment, please call your pharmacy. ? ?

## 2020-07-01 NOTE — Progress Notes (Signed)
Cardiology Office Note  Date: 07/01/2020   ID: Jon Shaw, DOB 11-Nov-1965, MRN [id number]  PCP:  Jon Slick, MD  Cardiologist:  Jon Rich, MD Electrophysiologist:  None   Chief Complaint: Follow-up pain in both upper extremities, CAD, HTN, HLD.  History of Present Illness: Jon Shaw is a 54 y.o. male with a history of CAD (status post cath and October 2020 with DES to mid LAD and residual 40% D1 stenosis and 40% distal LAD stenosis with medical management recommended).  Other history includes hypertension  Previous stress test due to intermittent chest pain episodes which showed no definitive ischemia and low risk study.  He was walking up to 3 miles a day at that time without anginal symptoms.  He continued on his current medical regimen including aspirin 81 mg daily, atorvastatin 80 mg daily, Brilinta 90 mg p.o. twice daily, lisinopril 20 mg daily and Lopressor 25 mg twice daily.  Last encounter with Jon An, PA on May 08, 2020.  He reported developing pain in his forearms bilaterally which occurred the prior evening.  Symptoms lasted for over 4 hours and were not associated with exertion.  He very ported as a cramping-like sensation.  He was started on Vistaril and prednisone the prior day for poison ivy.  Denies any associated chest pain or DOE.  Had ran a 5K the prior weekend and denied any anginal symptoms.  Noticed intermittent fatigue since stent placement and was questioning if this may be due to medication.  He presents today with no particular complaints.  Denies any anginal or exertional symptoms, palpitations or arrhythmias, orthostatic symptoms, stroke or TIA-like symptoms, lightheadedness, dizziness, presyncopal or syncopal episodes.  No PND or orthopnea no bleeding issues, no claudication-like symptoms, DVT or PE-like symptoms, or lower extremity edema.  He is having some issues with his knees and sees [facility] orthopedist.  States he has some  degenerative disease in both knees with some arthritis.  He is over a year out on his cardiac intervention and may stop his Brilinta.  He denies any further arm pain he states this was a one-time episode.  Past Medical History:  Diagnosis Date  . Coronary artery disease    a. s/p cath in 06/2019 with DES to mid-LAD and residual 40% D1 stenosis and 40% distal LAD stenosis with medical management recommended  . Depression   . GERD (gastroesophageal reflux disease)   . History of cardiac catheterization    EF 60% 6/11  . Hypertension   . Hypertriglyceridemia     Past Surgical History:  Procedure Laterality Date  . CARDIAC CATHETERIZATION    . CORONARY STENT INTERVENTION N/A 06/26/2019   Procedure: CORONARY STENT INTERVENTION;  Surgeon: Yates Decamp, MD;  Location: [facility];  Service: Cardiovascular;  Laterality: N/A;  . LEFT HEART CATH AND CORONARY ANGIOGRAPHY N/A 06/26/2019   Procedure: LEFT HEART CATH AND CORONARY ANGIOGRAPHY;  Surgeon: Yates Decamp, MD;  Location: [facility];  Service: Cardiovascular;  Laterality: N/A;    Current Outpatient Medications  Medication Sig Dispense Refill  . aspirin EC 81 MG tablet Take 81 mg by mouth at bedtime.    Marland Kitchen atorvastatin (LIPITOR) 80 MG tablet Take 1 tablet (80 mg total) by mouth daily at 6 PM. 30 tablet 2  . BRILINTA 90 MG TABS tablet TAKE 1 TABLET BY MOUTH TWICE A DAY 60 tablet 0  . buPROPion (WELLBUTRIN XL) 150 MG 24 hr tablet Take 1 tablet (150 mg total) by  mouth daily. (Patient taking differently: Take 150 mg by mouth at bedtime. ) 30 tablet 0  . lisinopril (ZESTRIL) 20 MG tablet Take 1 tablet (20 mg total) by mouth daily. 90 tablet 3  . metoprolol tartrate (LOPRESSOR) 25 MG tablet Take 0.5 tablets (12.5 mg total) by mouth 2 (two) times daily. 90 tablet 3  . nitroGLYCERIN (NITROSTAT) 0.4 MG SL tablet Place 1 tablet (0.4 mg total) under the tongue every 5 (five) minutes x 3 doses as needed for chest pain. 25 tablet 3  . omeprazole  (PRILOSEC) 20 MG capsule Take 40 mg by mouth at bedtime.    . sildenafil (VIAGRA) 100 MG tablet Take 1 tablet by mouth daily as needed.     No current facility-administered medications for this visit.   Allergies:  Sulfonamide derivatives   Social History: The patient  reports that he has never smoked. He has never used smokeless tobacco. He reports that he does not drink alcohol and does not use drugs.   Family History: The patient's family history includes Heart attack (age of onset: 46) in his father.   ROS:  Please see the history of present illness. Otherwise, complete review of systems is positive for none.  All other systems are reviewed and negative.   Physical Exam: VS:  BP 118/76   Pulse 60   Ht 6\' 3"  (1.905 m)   Wt 196 lb 6.4 oz (89.1 kg)   SpO2 95%   BMI 24.55 kg/m , BMI Body mass index is 24.55 kg/m.  Wt Readings from Last 3 Encounters:  07/01/20 196 lb 6.4 oz (89.1 kg)  05/08/20 197 lb (89.4 kg)  03/07/20 197 lb (89.4 kg)    General: Patient appears comfortable at rest. Neck: Supple, no elevated JVP or carotid bruits, no thyromegaly. Lungs: Clear to auscultation, nonlabored breathing at rest. Cardiac: Regular rate and rhythm, no S3 or significant systolic murmur, no pericardial rub. Extremities: No pitting edema, distal pulses 2+. Skin: Warm and dry. Musculoskeletal: No kyphosis. Neuropsychiatric: Alert and oriented x3, affect grossly appropriate.  ECG:  EKG 2020-05-08 showed normal sinus rhythm with a rate of 64.  Recent Labwork: 02/05/2020: ALT 49; AST 24; BUN 19; Creatinine, Ser 1.24; Hemoglobin 15.7; Platelets 260; Potassium 4.4; Sodium 141     Component Value Date/Time   CHOL 107 02/05/2020 1106   TRIG 112 02/05/2020 1106   HDL 29 (L) 02/05/2020 1106   CHOLHDL 3.7 02/05/2020 1106   CHOLHDL 5.2 06/26/2019 0843   VLDL 19 06/26/2019 0843   LDLCALC 57 02/05/2020 1106    Other Studies Reviewed Today:  Coronary angiogram 06/26/2019: Normal LVEF at 65  to 70% with mild intraventricular pressure gradient at the apex, no pressure gradient across the aortic valve. Normal EDP. Mild disease in RCA and circumflex. LAD is diffusely diseased, mid segment has 99% stenosis, D1 has 40% stenosis. Successful stenting of mid LAD with implantation of 3.0 x 28 mm Synergy DES, postdilated with 3.25 x 15 mm Sapphire [place] at 20 atmospheric pressure and distal and postdilated with 2.5 x 12 mm emerge [place] at 20 atmospheric pressure, overall stenosis reduced from 99% to 0% with TIMI-3 to TIMI-3 flow maintained. Patient has moderate diffuse disease in the mid to distal segment of the LAD and needs aggressive risk factor modification.  Recommendation:Patient will be discharged tomorrow if he remains stable with dual antiplatelet therapy including aspirin indefinitely and Brilinta at least for a period of 1 year.    Echocardiogram: 06/2019 IMPRESSIONS  1.  Left ventricular ejection fraction, by visual estimation, is 55 to  60%. The left ventricle has normal function. There is no left ventricular  hypertrophy.  2. Left ventricular diastolic function could not be evaluated pattern of  LV diastolic filling.  3. Global right ventricle was not assessed.The right ventricular size is  normal. No increase in right ventricular wall thickness.  4. Left atrial size was mildly dilated.  5. Mild mitral valve leaflet thickening with mild mitral regurgitation.  6. Normal right atrial pressure.    Lexiscan Myoview: 02/2020 Lexiscan (Walking with Camelia Phenes) Sestamibi Stress Test06/04/2020: Minimal decrease in counts in basal inferolateral region. Subtle very small sized inferolateral ischemia cannot be excluded. Defect may probably related to technique.  Overall LV systolic function is normal without regional wall motion abnormalities. Stress LV EF: 58%.  No previous exam available for comparison. Low risk study.     Assessment and Plan:  1. Bilateral arm pain    2. CAD in native artery   3. Essential hypertension   4. Mixed hyperlipidemia    1. Bilateral arm pain He denies any recurrence of his bilateral arm pain which he had at prior visit with Mrs. Patrick Jupiter.  2. CAD in native artery Has any anginal or exertional symptoms.  Continue aspirin daily.  Continue nitroglycerin 0.4 mg as needed.  Discontinue Brilinta.  3. Essential hypertension Blood pressure is well controlled at 118/76.  Continue lisinopril 20 mg daily.  4. Mixed hyperlipidemia Continue high intensity statin medication atorvastatin 80 mg daily.  Medication Adjustments/Labs and Tests Ordered: Current medicines are reviewed at length with the patient today.  Concerns regarding medicines are outlined above.   Disposition: Follow-up with Dr. Wyline Mood or APP 6 months  Signed, Rennis Harding, NP 07/01/2020 11:36 AM    [facility] at [facility] [address] [place], [place], [place] [address] Phone: [phone]; Fax: [phone]

## 2020-07-02 ENCOUNTER — Ambulatory Visit: Payer: BC Managed Care – PPO | Admitting: Cardiology

## 2020-08-08 ENCOUNTER — Ambulatory Visit: Payer: BC Managed Care – PPO | Admitting: Cardiology

## 2020-08-11 ENCOUNTER — Other Ambulatory Visit: Payer: Self-pay | Admitting: Cardiology

## 2020-09-16 IMAGING — DX DG CHEST 2V
2 series · 2 of 2 positions shown · non-contrast
Comparison: 02/13/2016

CLINICAL DATA: Chest pain

EXAM:
CHEST - 2 VIEW

[w chest pa]
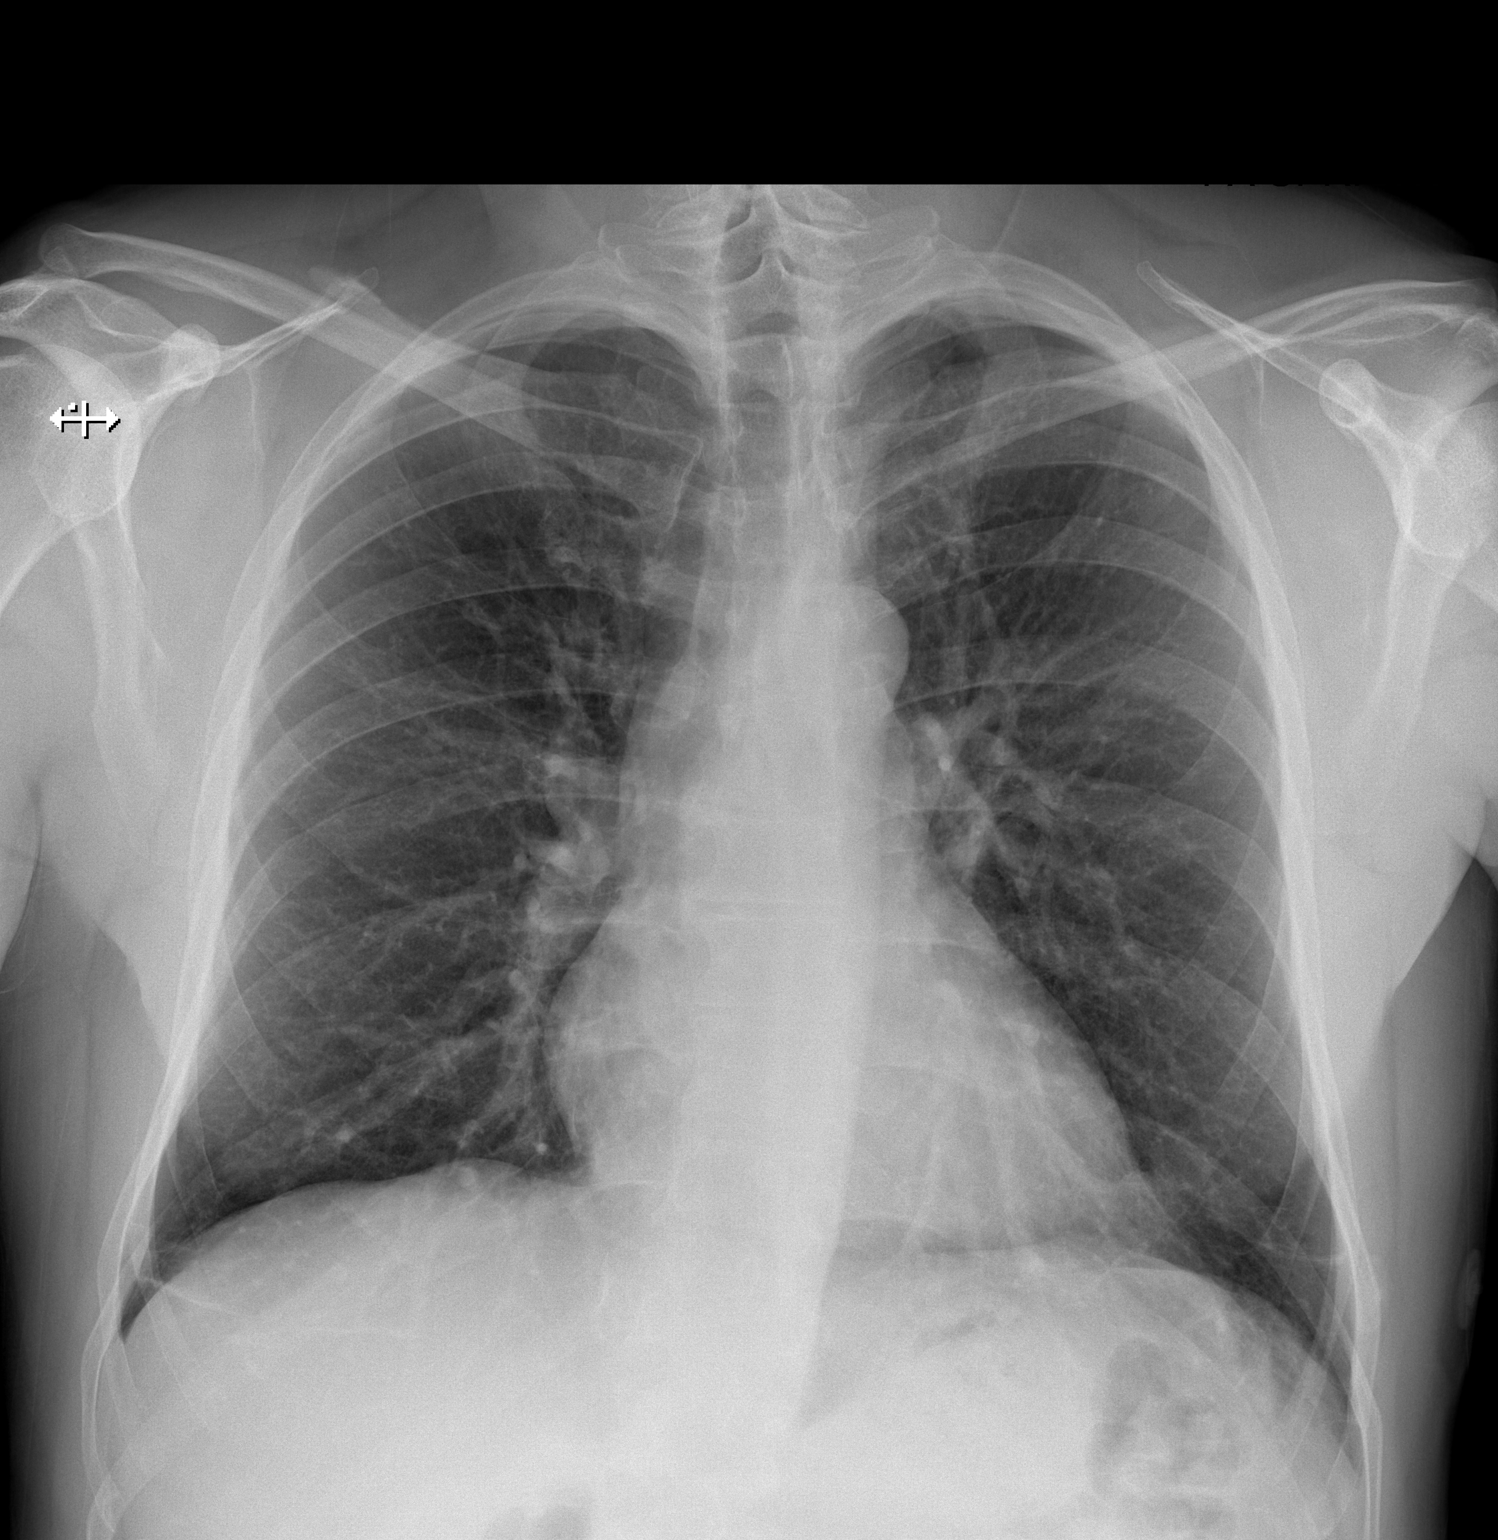

[w chest lat]
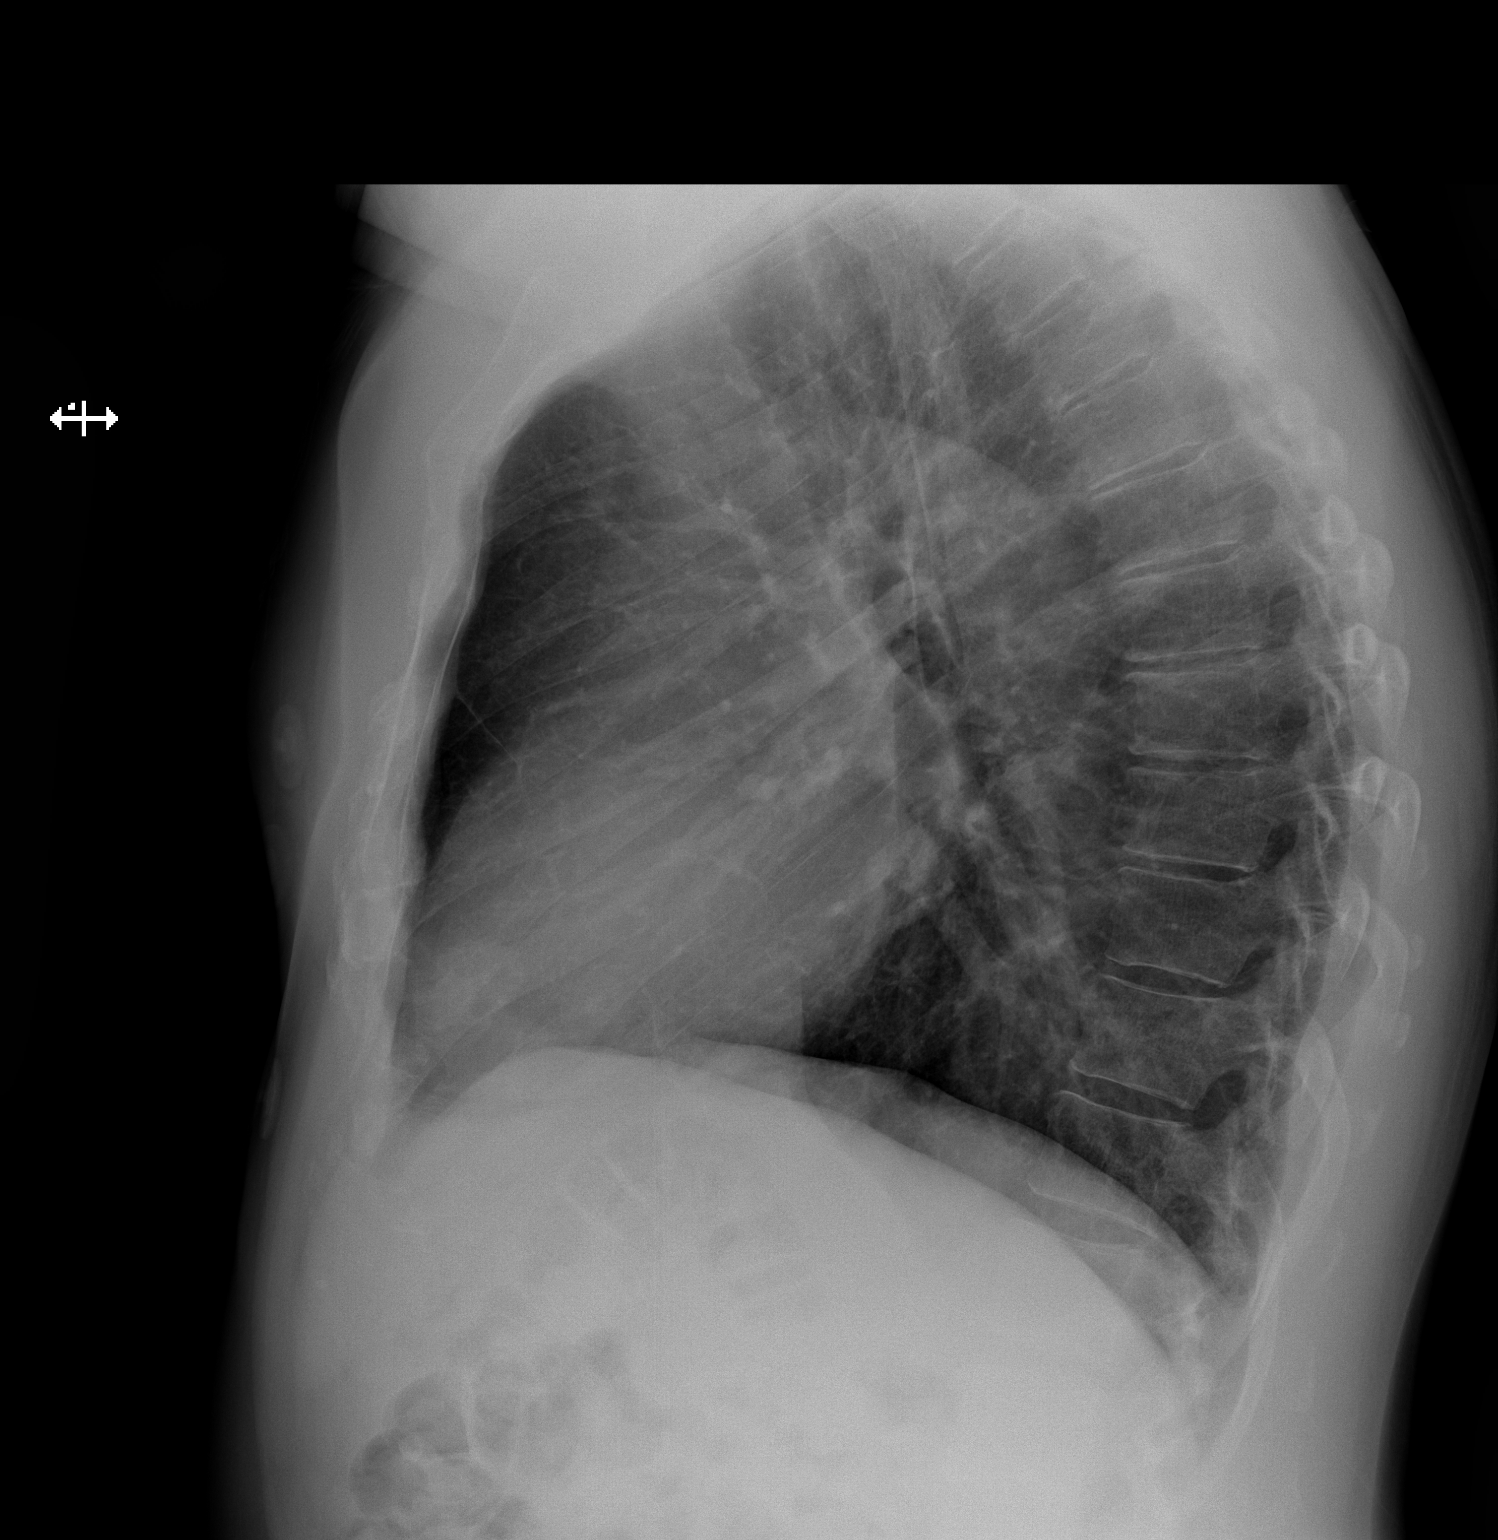

[2 of 2 positions shown; findings below may reference images not displayed]

FINDINGS: The heart size and mediastinal contours are within normal limits.
Both lungs are clear. The visualized skeletal structures are
unremarkable.
IMPRESSION: No active cardiopulmonary disease.

## 2020-09-23 ENCOUNTER — Other Ambulatory Visit: Payer: BC Managed Care – PPO

## 2021-02-19 ENCOUNTER — Emergency Department (HOSPITAL_COMMUNITY): Payer: BC Managed Care – PPO

## 2021-02-19 ENCOUNTER — Emergency Department (HOSPITAL_COMMUNITY)
Admission: EM | Admit: 2021-02-19 | Discharge: 2021-02-20 | Disposition: A | Discharge status: Home / Self Care | Payer: BC Managed Care – PPO | Attending: Emergency Medicine | Admitting: Emergency Medicine

## 2021-02-19 ENCOUNTER — Other Ambulatory Visit: Payer: Self-pay

## 2021-02-19 DIAGNOSIS — I10 Essential (primary) hypertension: Secondary | ICD-10-CM | POA: Insufficient documentation

## 2021-02-19 DIAGNOSIS — Z7982 Long term (current) use of aspirin: Secondary | ICD-10-CM | POA: Diagnosis not present

## 2021-02-19 DIAGNOSIS — R519 Headache, unspecified: Secondary | ICD-10-CM | POA: Diagnosis not present

## 2021-02-19 DIAGNOSIS — I2510 Atherosclerotic heart disease of native coronary artery without angina pectoris: Secondary | ICD-10-CM | POA: Insufficient documentation

## 2021-02-19 DIAGNOSIS — Z79899 Other long term (current) drug therapy: Secondary | ICD-10-CM | POA: Insufficient documentation

## 2021-02-19 LAB — CBC WITH DIFFERENTIAL/PLATELET
Abs Immature Granulocytes: 0.03 10*3/uL (ref 0.00–0.07)
Basophils Absolute: 0 10*3/uL (ref 0.0–0.1)
Basophils Relative: 1 %
Eosinophils Absolute: 0 10*3/uL (ref 0.0–0.5)
Eosinophils Relative: 1 %
HCT: 45.3 % (ref 39.0–52.0)
Hemoglobin: 14.9 g/dL (ref 13.0–17.0)
Immature Granulocytes: 1 %
Lymphocytes Relative: 24 %
Lymphs Abs: 1.6 10*3/uL (ref 0.7–4.0)
MCH: 29.3 pg (ref 26.0–34.0)
MCHC: 32.9 g/dL (ref 30.0–36.0)
MCV: 89 fL (ref 80.0–100.0)
Monocytes Absolute: 0.6 10*3/uL (ref 0.1–1.0)
Monocytes Relative: 9 %
Neutro Abs: 4.3 10*3/uL (ref 1.7–7.7)
Neutrophils Relative %: 64 %
Platelets: 381 10*3/uL (ref 150–400)
RBC: 5.09 MIL/uL (ref 4.22–5.81)
RDW: 11.8 % (ref 11.5–15.5)
WBC: 6.6 10*3/uL (ref 4.0–10.5)
nRBC: 0 % (ref 0.0–0.2)

## 2021-02-19 LAB — COMPREHENSIVE METABOLIC PANEL
ALT: 22 U/L (ref 0–44)
AST: 17 U/L (ref 15–41)
Albumin: 3.8 g/dL (ref 3.5–5.0)
Alkaline Phosphatase: 62 U/L (ref 38–126)
Anion gap: 11 (ref 5–15)
BUN: 17 mg/dL (ref 6–20)
CO2: 26 mmol/L (ref 22–32)
Calcium: 9.3 mg/dL (ref 8.9–10.3)
Chloride: 100 mmol/L (ref 98–111)
Creatinine, Ser: 1.15 mg/dL (ref 0.61–1.24)
GFR, Estimated: >60 mL/min (ref >60)
Glucose, Bld: 96 mg/dL (ref 70–99)
Potassium: 4 mmol/L (ref 3.5–5.1)
Sodium: 137 mmol/L (ref 135–145)
Total Bilirubin: 0.7 mg/dL (ref 0.3–1.2)
Total Protein: 7.5 g/dL (ref 6.5–8.1)

## 2021-02-19 LAB — C-REACTIVE PROTEIN: CRP: 1.1 mg/dL — ABNORMAL HIGH (ref <1.0)

## 2021-02-19 LAB — SEDIMENTATION RATE: Sed Rate: 27 mm/hr — ABNORMAL HIGH (ref 0–16)

## 2021-02-19 MED ORDER — AZITHROMYCIN 250 MG PO TABS
250.0000 mg | ORAL_TABLET | Freq: Every day | ORAL | 0 refills | Status: DC
Start: 2021-02-19 — End: 2021-05-20

## 2021-02-19 MED ORDER — AZITHROMYCIN 250 MG PO TABS
500.0000 mg | ORAL_TABLET | Freq: Once | ORAL | Status: AC
  Administered 2021-02-19: 500 mg via ORAL
  Filled 2021-02-19: qty 2

## 2021-02-19 NOTE — ED Triage Notes (Signed)
Pt sent by tele health MD for further eval of headache x 1 month, here to rule out temporal arteritis.

## 2021-02-19 NOTE — ED Provider Notes (Signed)
Emergency Medicine Provider Triage Evaluation Note  Jon Shaw , a 55 y.o. male  was evaluated in triage.  Pt complains of right-sided headache for the past month.  It will be intermittent in severity but will be persistent.  States that it is near his right temple.  Had a virtual visit shade was sent to the ER to rule out possible?  Temporal arteritis.  Has been taking all over-the-counter medications with only minimal improvement.  Has been having blurry vision.  No numbness, weakness, head injuries.  Review of Systems  Positive: Right-sided headache, vision changes Negative: Numbness, weakness  Physical Exam  BP (!) 134/94 (BP Location: Left Arm)   Pulse 63   Temp 98.9 F (37.2 C)   Resp 16   SpO2 97%  Gen:   Awake, no distress Resp:  Normal effort MSK:   Moves extremities without difficulty Other:  Tenderness palpation of the right temple area without overlying skin changes.  Strength 5/5 in bilateral upper and lower extremities.  No facial asymmetry  Medical Decision Making  Medically screening exam initiated at 5:07 PM.  Appropriate orders placed.  [name] was informed that the remainder of the evaluation will be completed by another provider, this initial triage assessment does not replace that evaluation, and the importance of remaining in the ED until their evaluation is complete.  Will obtain lab work and imaging for this new onset headache   Dietrich Pates, PA-C 02/19/21 1708    Gwyneth Sprout, MD 02/20/21 [phone]

## 2021-02-19 NOTE — ED Provider Notes (Signed)
[facility] EMERGENCY DEPARTMENT Provider Note   CSN: [id number] Arrival date & time: 02/19/21  1654     History Chief Complaint  Patient presents with  . Headache    Jon Shaw is a 55 y.o. male.  Patient is a 55 year old male with history of coronary artery disease with stent, hypertension, hyperlipidemia.  Patient presenting today for evaluation of headache.  He describes a 1 month history of waxing and waning pressure to the right side of his head.  He denies nausea or vomiting.  He denies visual disturbances, weakness, or numbness.  He has been taking Tylenol and ibuprofen with little relief.  He had a telemedicine visit and was advised to come to the ER for further evaluation.  The history is provided by the patient.  Headache Pain location:  R temporal Quality: Pressure. Radiates to:  Does not radiate Onset quality:  Gradual Duration:  4 weeks Timing:  Constant Progression:  Waxing and waning Chronicity:  New Relieved by:  Nothing Worsened by:  Nothing Ineffective treatments:  None tried      Past Medical History:  Diagnosis Date  . Coronary artery disease    a. s/p cath in 06/2019 with DES to mid-LAD and residual 40% D1 stenosis and 40% distal LAD stenosis with medical management recommended  . Depression   . GERD (gastroesophageal reflux disease)   . History of cardiac catheterization    EF 60% 6/11  . Hypertension   . Hypertriglyceridemia     Patient Active Problem List   Diagnosis Date Noted  . Primary hypertension 02/06/2020  . Unstable angina (HCC) 06/26/2019  . Coronary artery disease of native artery of native heart with stable angina pectoris (HCC) 06/26/2019  . MDD (major depressive disorder), severe (HCC) 05/13/2016  . Mixed hyperlipidemia 03/09/2010  . GERD 03/09/2010  . CHEST PAIN 03/09/2010    Past Surgical History:  Procedure Laterality Date  . CARDIAC CATHETERIZATION    . CORONARY STENT INTERVENTION N/A 06/26/2019    Procedure: CORONARY STENT INTERVENTION;  Surgeon: Yates Decamp, MD;  Location: [facility];  Service: Cardiovascular;  Laterality: N/A;  . LEFT HEART CATH AND CORONARY ANGIOGRAPHY N/A 06/26/2019   Procedure: LEFT HEART CATH AND CORONARY ANGIOGRAPHY;  Surgeon: Yates Decamp, MD;  Location: [facility];  Service: Cardiovascular;  Laterality: N/A;       Family History  Problem Relation Age of Onset  . Heart attack Father 27    Social History   Tobacco Use  . Smoking status: Never Smoker  . Smokeless tobacco: Never Used  Vaping Use  . Vaping Use: Never used  Substance Use Topics  . Alcohol use: No  . Drug use: No    Home Medications Prior to Admission medications   Medication Sig Start Date End Date Taking? Authorizing Provider  aspirin EC 81 MG tablet Take 81 mg by mouth at bedtime.    [provider]  atorvastatin (LIPITOR) 80 MG tablet Take 1 tablet (80 mg total) by mouth daily at 6 PM. 06/27/19   Yates Decamp, MD  buPROPion (WELLBUTRIN XL) 150 MG 24 hr tablet Take 1 tablet (150 mg total) by mouth daily. Patient taking differently: Take 150 mg by mouth at bedtime.  05/18/16   Adonis Brook, NP  lisinopril (ZESTRIL) 20 MG tablet Take 1 tablet (20 mg total) by mouth daily. 08/15/19 07/01/21  Toniann Fail, NP  metoprolol tartrate (LOPRESSOR) 25 MG tablet Take 0.5 tablets (12.5 mg total) by  mouth 2 (two) times daily. 05/08/20 08/06/20  Strader, Lennart Pall, PA-C  nitroGLYCERIN (NITROSTAT) 0.4 MG SL tablet Place 1 tablet (0.4 mg total) under the tongue every 5 (five) minutes x 3 doses as needed for chest pain. 06/27/19   Yates Decamp, MD  omeprazole (PRILOSEC) 20 MG capsule Take 40 mg by mouth at bedtime.    [provider]  sildenafil (VIAGRA) 100 MG tablet Take 1 tablet by mouth daily as needed. 07/11/19 07/10/20  [provider]    Allergies    Sulfonamide derivatives  Review of Systems   Review of Systems  Neurological: Positive for  headaches.  All other systems reviewed and are negative.   Physical Exam Updated Vital Signs BP (!) 152/99 (BP Location: Right Arm)   Pulse 60   Temp 98.9 F (37.2 C) (Oral)   Resp 16   SpO2 97%   Physical Exam Vitals and nursing note reviewed.  Constitutional:      General: He is not in acute distress.    Appearance: He is well-developed. He is not diaphoretic.  HENT:     Head: Normocephalic and atraumatic.     Comments: There is no tenderness to palpation over the temporal artery. Eyes:     General: No visual field deficit.    Extraocular Movements: Extraocular movements intact.     Pupils: Pupils are equal, round, and reactive to light.  Cardiovascular:     Rate and Rhythm: Normal rate and regular rhythm.     Heart sounds: No murmur heard. No friction rub.  Pulmonary:     Effort: Pulmonary effort is normal. No respiratory distress.     Breath sounds: Normal breath sounds. No wheezing or rales.  Abdominal:     General: Bowel sounds are normal. There is no distension.     Palpations: Abdomen is soft.     Tenderness: There is no abdominal tenderness.  Musculoskeletal:        General: Normal range of motion.     Cervical back: Normal range of motion and neck supple.  Skin:    General: Skin is warm and dry.  Neurological:     Mental Status: He is alert and oriented to person, place, and time.     Cranial Nerves: No cranial nerve deficit or facial asymmetry.     Sensory: No sensory deficit.     Motor: No weakness.     Coordination: Coordination normal.     ED Results / Procedures / Treatments   Labs (all labs ordered are listed, but only abnormal results are displayed) Labs Reviewed  SEDIMENTATION RATE - Abnormal; Notable for the following components:      Result Value   Sed Rate 27 (*)    All other components within normal limits  C-REACTIVE PROTEIN - Abnormal; Notable for the following components:   CRP 1.1 (*)    All other components within normal limits   CBC WITH DIFFERENTIAL/PLATELET  COMPREHENSIVE METABOLIC PANEL    EKG None  Radiology CT Head Wo Contrast  Result Date: 02/19/2021 CLINICAL DATA:  Headache EXAM: CT HEAD WITHOUT CONTRAST TECHNIQUE: Contiguous axial images were obtained from the base of the skull through the vertex without intravenous contrast. COMPARISON:  None. FINDINGS: Brain: No acute intracranial abnormality. Specifically, no hemorrhage, hydrocephalus, mass lesion, acute infarction, or significant intracranial injury. Vascular: No hyperdense vessel or unexpected calcification. Skull: No acute calvarial abnormality. Sinuses/Orbits: Opacified right mastoid and frontal sinuses and right ethmoid air cells Other: None IMPRESSION: No  intracranial abnormality. Right chronic sinusitis. Electronically Signed   By: Charlett Nose M.D.   On: 02/19/2021 17:58    Procedures Procedures   Medications Ordered in ED Medications  azithromycin (ZITHROMAX) tablet 500 mg (has no administration in time range)    ED Course  I have reviewed the triage vital signs and the nursing notes.  Pertinent labs & imaging results that were available during my care of the patient were reviewed by me and considered in my medical decision making (see chart for details).    MDM Rules/Calculators/A&P  Patient presenting with right-sided headache for the past month.  Patient is neurologically intact with CT showing no intracranial abnormality, but does have opacification of the right mastoid and frontal sinuses and right ethmoid air cells.  Patient's symptoms may be related to sinusitis.  Patient to be treated with Zithromax and is to follow-up with primary doctor if not improving.  Also considered is temporal arteritis, however patient has no tenderness over the temporal artery and sed rate is 27.  I highly doubt this possibility.  Final Clinical Impression(s) / ED Diagnoses Final diagnoses:  None    Rx / DC Orders ED Discharge Orders    None        Geoffery Lyons, MD 02/19/21 2344

## 2021-02-19 NOTE — Discharge Instructions (Addendum)
Begin taking Zithromax as prescribed.  Continue taking Tylenol and ibuprofen as needed for pain.  Follow-up with your primary doctor if symptoms are not improving in the next week, and return to the ER if symptoms significantly worsen or change.

## 2021-03-10 DIAGNOSIS — Z125 Encounter for screening for malignant neoplasm of prostate: Secondary | ICD-10-CM | POA: Diagnosis not present

## 2021-03-10 DIAGNOSIS — Z13220 Encounter for screening for lipoid disorders: Secondary | ICD-10-CM | POA: Diagnosis not present

## 2021-03-10 DIAGNOSIS — Z136 Encounter for screening for cardiovascular disorders: Secondary | ICD-10-CM | POA: Diagnosis not present

## 2021-03-10 DIAGNOSIS — R7302 Impaired glucose tolerance (oral): Secondary | ICD-10-CM | POA: Diagnosis not present

## 2021-03-10 DIAGNOSIS — Z0000 Encounter for general adult medical examination without abnormal findings: Secondary | ICD-10-CM | POA: Diagnosis not present

## 2021-03-10 DIAGNOSIS — Z7289 Other problems related to lifestyle: Secondary | ICD-10-CM | POA: Diagnosis not present

## 2021-03-18 DIAGNOSIS — S134XXA Sprain of ligaments of cervical spine, initial encounter: Secondary | ICD-10-CM | POA: Diagnosis not present

## 2021-03-18 DIAGNOSIS — M436 Torticollis: Secondary | ICD-10-CM | POA: Diagnosis not present

## 2021-03-18 DIAGNOSIS — S233XXA Sprain of ligaments of thoracic spine, initial encounter: Secondary | ICD-10-CM | POA: Diagnosis not present

## 2021-03-19 DIAGNOSIS — S233XXA Sprain of ligaments of thoracic spine, initial encounter: Secondary | ICD-10-CM | POA: Diagnosis not present

## 2021-03-19 DIAGNOSIS — S134XXA Sprain of ligaments of cervical spine, initial encounter: Secondary | ICD-10-CM | POA: Diagnosis not present

## 2021-03-19 DIAGNOSIS — Z1211 Encounter for screening for malignant neoplasm of colon: Secondary | ICD-10-CM | POA: Diagnosis not present

## 2021-03-19 DIAGNOSIS — M436 Torticollis: Secondary | ICD-10-CM | POA: Diagnosis not present

## 2021-03-25 DIAGNOSIS — M436 Torticollis: Secondary | ICD-10-CM | POA: Diagnosis not present

## 2021-03-25 DIAGNOSIS — S233XXA Sprain of ligaments of thoracic spine, initial encounter: Secondary | ICD-10-CM | POA: Diagnosis not present

## 2021-03-25 DIAGNOSIS — S134XXA Sprain of ligaments of cervical spine, initial encounter: Secondary | ICD-10-CM | POA: Diagnosis not present

## 2021-03-30 DIAGNOSIS — R001 Bradycardia, unspecified: Secondary | ICD-10-CM | POA: Diagnosis not present

## 2021-03-30 DIAGNOSIS — Z6824 Body mass index (BMI) 24.0-24.9, adult: Secondary | ICD-10-CM | POA: Diagnosis not present

## 2021-04-02 LAB — COLOGUARD: COLOGUARD: NEGATIVE

## 2021-05-20 ENCOUNTER — Ambulatory Visit: Payer: BC Managed Care – PPO | Admitting: Cardiology

## 2021-05-20 ENCOUNTER — Encounter: Payer: Self-pay | Admitting: Cardiology

## 2021-05-20 ENCOUNTER — Other Ambulatory Visit: Payer: Self-pay

## 2021-05-20 VITALS — BP 102/64 | HR 68 | Ht 75.0 in | Wt 193.8 lb

## 2021-05-20 DIAGNOSIS — E782 Mixed hyperlipidemia: Secondary | ICD-10-CM | POA: Diagnosis not present

## 2021-05-20 DIAGNOSIS — I10 Essential (primary) hypertension: Secondary | ICD-10-CM

## 2021-05-20 DIAGNOSIS — I2510 Atherosclerotic heart disease of native coronary artery without angina pectoris: Secondary | ICD-10-CM | POA: Diagnosis not present

## 2021-05-20 NOTE — Patient Instructions (Addendum)

## 2021-05-20 NOTE — Progress Notes (Signed)
Clinical Summary [name] is a 55 y.o.male prior patient of Dr Tenny Craw, this is our first visit together.  1.CAD - DES to mid LAD in 06/2019, residual 40% D1 and distal LAD diease - 02/2020 nuclear stress: no clear ischemia, likely artifact inferolateral cannot completely exclude very mild ischemia. Low risk - 06/2019 echo LVEF 55-60%, mild MR    - no recent chest pain. No SOB/DOE - compliant with meds - fatigue/low bp's on lopressor 12.5mg  bid, changed to toprol 12.5mg  daily.   2. HTN - compliant with meds   3. Hyperlipidemia 02/2021 TC 103 TG 156 HDL 27 LDL 49   SH: upcoming cruise in November, 11 day cruise.  Semi retired, works as Education officer, environmental.   Past Medical History:  Diagnosis Date   Coronary artery disease    a. s/p cath in 06/2019 with DES to mid-LAD and residual 40% D1 stenosis and 40% distal LAD stenosis with medical management recommended   Depression    GERD (gastroesophageal reflux disease)    History of cardiac catheterization    EF 60% 6/11   Hypertension    Hypertriglyceridemia      Allergies  Allergen Reactions   Sulfonamide Derivatives Other (See Comments)    REACTION: disoriented     Current Outpatient Medications  Medication Sig Dispense Refill   aspirin EC 81 MG tablet Take 81 mg by mouth at bedtime.     atorvastatin (LIPITOR) 80 MG tablet Take 1 tablet (80 mg total) by mouth daily at 6 PM. 30 tablet 2   azithromycin (ZITHROMAX) 250 MG tablet Take 1 tablet (250 mg total) by mouth daily. 4 tablet 0   buPROPion (WELLBUTRIN XL) 150 MG 24 hr tablet Take 1 tablet (150 mg total) by mouth daily. (Patient taking differently: Take 150 mg by mouth at bedtime. ) 30 tablet 0   lisinopril (ZESTRIL) 20 MG tablet Take 1 tablet (20 mg total) by mouth daily. 90 tablet 3   metoprolol tartrate (LOPRESSOR) 25 MG tablet Take 0.5 tablets (12.5 mg total) by mouth 2 (two) times daily. 90 tablet 3   nitroGLYCERIN (NITROSTAT) 0.4 MG SL tablet Place 1 tablet (0.4 mg  total) under the tongue every 5 (five) minutes x 3 doses as needed for chest pain. 25 tablet 3   omeprazole (PRILOSEC) 20 MG capsule Take 40 mg by mouth at bedtime.     sildenafil (VIAGRA) 100 MG tablet Take 1 tablet by mouth daily as needed.     No current facility-administered medications for this visit.     Past Surgical History:  Procedure Laterality Date   CARDIAC CATHETERIZATION     CORONARY STENT INTERVENTION N/A 06/26/2019   Procedure: CORONARY STENT INTERVENTION;  Surgeon: Jon Decamp, MD;  Location: [facility];  Service: Cardiovascular;  Laterality: N/A;   LEFT HEART CATH AND CORONARY ANGIOGRAPHY N/A 06/26/2019   Procedure: LEFT HEART CATH AND CORONARY ANGIOGRAPHY;  Surgeon: Jon Decamp, MD;  Location: [facility];  Service: Cardiovascular;  Laterality: N/A;     Allergies  Allergen Reactions   Sulfonamide Derivatives Other (See Comments)    REACTION: disoriented      Family History  Problem Relation Age of Onset   Heart attack Father 95     Social History Jon Shaw reports that he has never smoked. He has never used smokeless tobacco. Jon Shaw reports no history of alcohol use.   Review of Systems CONSTITUTIONAL: No weight loss, fever, chills, weakness or fatigue.  HEENT: Eyes: No visual loss, blurred vision, double vision or yellow sclerae.No hearing loss, sneezing, congestion, runny nose or sore throat.  SKIN: No rash or itching.  CARDIOVASCULAR: per hpi RESPIRATORY: No shortness of breath, cough or sputum.  GASTROINTESTINAL: No anorexia, nausea, vomiting or diarrhea. No abdominal pain or blood.  GENITOURINARY: No burning on urination, no polyuria NEUROLOGICAL: No headache, dizziness, syncope, paralysis, ataxia, numbness or tingling in the extremities. No change in bowel or bladder control.  MUSCULOSKELETAL: No muscle, back pain, joint pain or stiffness.  LYMPHATICS: No enlarged nodes. No history of splenectomy.  PSYCHIATRIC: No history of  depression or anxiety.  ENDOCRINOLOGIC: No reports of sweating, cold or heat intolerance. No polyuria or polydipsia.  Marland Kitchen   Physical Examination Today's Vitals   05/20/21 1339  BP: 102/64  Pulse: 68  SpO2: 98%  Weight: 193 lb 12.8 oz (87.9 kg)  Height: 6\' 3"  (1.905 m)   Body mass index is 24.22 kg/m.  Gen: resting comfortably, no acute distress HEENT: no scleral icterus, pupils equal round and reactive, no palptable cervical adenopathy,  CV: RRR, no m/r/g no jvd Resp: Clear to auscultation bilaterally GI: abdomen is soft, non-tender, non-distended, normal bowel sounds, no hepatosplenomegaly MSK: extremities are warm, no edema.  Skin: warm, no rash Neuro:  no focal deficits Psych: appropriate affect   Diagnostic Studies     Assessment and Plan  CAD - no recent symptoms, continue current meds  2. Hyperlipidemia _ LDL at goal, discussed diet and exercis to improve TGs and HDL  3. HTN  -at goal, continue current meds.    F/u 6 months   , M.D.

## 2021-07-23 DIAGNOSIS — S233XXA Sprain of ligaments of thoracic spine, initial encounter: Secondary | ICD-10-CM | POA: Diagnosis not present

## 2021-07-23 DIAGNOSIS — S134XXA Sprain of ligaments of cervical spine, initial encounter: Secondary | ICD-10-CM | POA: Diagnosis not present

## 2021-07-23 DIAGNOSIS — M436 Torticollis: Secondary | ICD-10-CM | POA: Diagnosis not present

## 2021-10-02 ENCOUNTER — Other Ambulatory Visit: Payer: Self-pay

## 2021-10-02 ENCOUNTER — Other Ambulatory Visit: Payer: Self-pay | Admitting: Student

## 2021-10-02 MED ORDER — METOPROLOL SUCCINATE ER 25 MG PO TB24: 12.5000 mg | ORAL_TABLET | Freq: Every morning | ORAL | 3 refills | Status: DC

## 2021-11-19 ENCOUNTER — Ambulatory Visit: Payer: BC Managed Care – PPO | Admitting: Cardiology

## 2021-11-19 NOTE — Progress Notes (Deleted)
? ? ? ?Clinical Summary ?Mr. Robello is a 56 y.o.male ? ?1.CAD ?- DES to mid LAD in 06/2019, residual 40% D1 and distal LAD diease ?- 02/2020 nuclear stress: no clear ischemia, likely artifact inferolateral cannot completely exclude very mild ischemia. Low risk ?- 06/2019 echo LVEF 55-60%, mild MR ?  ?  ?  ?- no recent chest pain. No SOB/DOE ?- compliant with meds ?- fatigue/low bp's on lopressor 12.5mg  bid, changed to toprol 12.5mg  daily.  ?  ?2. HTN ?- compliant with meds ?  ?  ?3. Hyperlipidemia ?02/2021 TC 103 TG 156 HDL 27 LDL 49 ?  ?  ?SH: upcoming cruise in November, 11 day cruise.  ?Semi retired, works as Theme park manager.  ?Past Medical History:  ?Diagnosis Date  ? Coronary artery disease   ? a. s/p cath in 06/2019 with DES to mid-LAD and residual 40% D1 stenosis and 40% distal LAD stenosis with medical management recommended  ? Depression   ? GERD (gastroesophageal reflux disease)   ? History of cardiac catheterization   ? EF 60% 6/11  ? Hypertension   ? Hypertriglyceridemia   ? ? ? ?Allergies  ?Allergen Reactions  ? Sulfonamide Derivatives Other (See Comments)  ?  REACTION: disoriented  ? ? ? ?Current Outpatient Medications  ?Medication Sig Dispense Refill  ? aspirin EC 81 MG tablet Take 81 mg by mouth at bedtime.    ? atorvastatin (LIPITOR) 80 MG tablet Take 1 tablet (80 mg total) by mouth daily at 6 PM. 30 tablet 2  ? buPROPion (WELLBUTRIN XL) 150 MG 24 hr tablet Take 1 tablet (150 mg total) by mouth daily. (Patient taking differently: Take 150 mg by mouth at bedtime.) 30 tablet 0  ? lisinopril (ZESTRIL) 20 MG tablet Take 1 tablet (20 mg total) by mouth daily. 90 tablet 3  ? metoprolol succinate (TOPROL-XL) 25 MG 24 hr tablet Take 0.5 tablets (12.5 mg total) by mouth every morning. 45 tablet 3  ? nitroGLYCERIN (NITROSTAT) 0.4 MG SL tablet Place 1 tablet (0.4 mg total) under the tongue every 5 (five) minutes x 3 doses as needed for chest pain. 25 tablet 3  ? omeprazole (PRILOSEC) 20 MG capsule Take 40 mg by mouth  at bedtime.    ? sildenafil (VIAGRA) 100 MG tablet Take 1 tablet by mouth daily as needed.    ? ?No current facility-administered medications for this visit.  ? ? ? ?Past Surgical History:  ?Procedure Laterality Date  ? CARDIAC CATHETERIZATION    ? CORONARY STENT INTERVENTION N/A 06/26/2019  ? Procedure: CORONARY STENT INTERVENTION;  Surgeon: Adrian Prows, MD;  Location: [facility];  Service: Cardiovascular;  Laterality: N/A;  ? LEFT HEART CATH AND CORONARY ANGIOGRAPHY N/A 06/26/2019  ? Procedure: LEFT HEART CATH AND CORONARY ANGIOGRAPHY;  Surgeon: Adrian Prows, MD;  Location: [facility];  Service: Cardiovascular;  Laterality: N/A;  ? ? ? ?Allergies  ?Allergen Reactions  ? Sulfonamide Derivatives Other (See Comments)  ?  REACTION: disoriented  ? ? ? ? ?Family History  ?Problem Relation Age of Onset  ? Heart attack Father 65  ? ? ? ?Social History ?Mr. Gillan reports that he has never smoked. He has never used smokeless tobacco. ?Mr. Woodridge reports no history of alcohol use. ? ? ?Review of Systems ?CONSTITUTIONAL: No weight loss, fever, chills, weakness or fatigue.  ?HEENT: Eyes: No visual loss, blurred vision, double vision or yellow sclerae.No hearing loss, sneezing, congestion, runny nose or sore throat.  ?SKIN: No  rash or itching.  ?CARDIOVASCULAR:  ?RESPIRATORY: No shortness of breath, cough or sputum.  ?GASTROINTESTINAL: No anorexia, nausea, vomiting or diarrhea. No abdominal pain or blood.  ?GENITOURINARY: No burning on urination, no polyuria ?NEUROLOGICAL: No headache, dizziness, syncope, paralysis, ataxia, numbness or tingling in the extremities. No change in bowel or bladder control.  ?MUSCULOSKELETAL: No muscle, back pain, joint pain or stiffness.  ?LYMPHATICS: No enlarged nodes. No history of splenectomy.  ?PSYCHIATRIC: No history of depression or anxiety.  ?ENDOCRINOLOGIC: No reports of sweating, cold or heat intolerance. No polyuria or polydipsia.  ?. ? ? ?Physical Examination ?There were no  vitals filed for this visit. ?There were no vitals filed for this visit. ? ?Gen: resting comfortably, no acute distress ?HEENT: no scleral icterus, pupils equal round and reactive, no palptable cervical adenopathy,  ?CV ?Resp: Clear to auscultation bilaterally ?GI: abdomen is soft, non-tender, non-distended, normal bowel sounds, no hepatosplenomegaly ?MSK: extremities are warm, no edema.  ?Skin: warm, no rash ?Neuro:  no focal deficits ?Psych: appropriate affect ? ? ?Diagnostic Studies ? ? ? ? ?Assessment and Plan  ? ?CAD ?- no recent symptoms, continue current meds ?  ?2. Hyperlipidemia ?_ LDL at goal, discussed diet and exercis to improve TGs and HDL ?  ?3. HTN ? -at goal, continue current meds.  ? ? ? ? ?Arnoldo Lenis, M.D., F.A.C.C. ?

## 2021-11-25 ENCOUNTER — Other Ambulatory Visit: Payer: Self-pay | Admitting: Student

## 2021-12-01 ENCOUNTER — Other Ambulatory Visit: Payer: Self-pay | Admitting: Cardiology

## 2022-01-14 ENCOUNTER — Encounter: Payer: Self-pay | Admitting: Cardiology

## 2022-01-14 ENCOUNTER — Ambulatory Visit: Payer: BC Managed Care – PPO | Admitting: Cardiology

## 2022-01-14 VITALS — BP 104/70 | HR 68 | Ht 75.0 in | Wt 201.2 lb

## 2022-01-14 DIAGNOSIS — I10 Essential (primary) hypertension: Secondary | ICD-10-CM | POA: Diagnosis not present

## 2022-01-14 DIAGNOSIS — E782 Mixed hyperlipidemia: Secondary | ICD-10-CM | POA: Diagnosis not present

## 2022-01-14 DIAGNOSIS — I2510 Atherosclerotic heart disease of native coronary artery without angina pectoris: Secondary | ICD-10-CM

## 2022-01-14 MED ORDER — METOPROLOL SUCCINATE ER 25 MG PO TB24: 12.5000 mg | ORAL_TABLET | Freq: Every morning | ORAL | 3 refills | Status: DC

## 2022-01-14 MED ORDER — ATORVASTATIN CALCIUM 80 MG PO TABS: 80.0000 mg | ORAL_TABLET | Freq: Every evening | ORAL | 3 refills | Status: DC

## 2022-01-14 NOTE — Patient Instructions (Signed)

## 2022-01-14 NOTE — Progress Notes (Signed)
? ? ? ?Clinical Summary ?Jon Shaw is a 56 y.o.male seen today for follow up of the following medical problem.s ? ?1.CAD ?- DES to mid LAD in 06/2019, residual 40% D1 and distal LAD diease ?- 02/2020 nuclear stress: no clear ischemia, likely artifact inferolateral cannot completely exclude very mild ischemia. Low risk ?- 06/2019 echo LVEF 55-60%, mild MR ?  ?  ? ?- fatigue/low bp's on lopressor 12.5mg  bid, changed to toprol 12.5mg  daily.  ? ?- for about 1 month after eating, tightness left chest. Would last about 1 hour, some indigestion.  ?- no exertional symptoms.  ?- compliant with meds ?  ?2. HTN ?- compliant with meds ?  ?  ?3. Hyperlipidemia ?02/2021 TC 103 TG 156 HDL 27 LDL 49 ?- compliant with atorvastatin. ? ?4. Day time somnolence ?- sleeps about 5-6 hours. Troubles staying asleep ?- takes some naps during day time.  ? ? ?  ?SH:  ?Semi retired, works as Education officer, environmental. Has been on 19 cruises, his wife has been on 20 ? ?Past Medical History:  ?Diagnosis Date  ? Coronary artery disease   ? a. s/p cath in 06/2019 with DES to mid-LAD and residual 40% D1 stenosis and 40% distal LAD stenosis with medical management recommended  ? Depression   ? GERD (gastroesophageal reflux disease)   ? History of cardiac catheterization   ? EF 60% 6/11  ? Hypertension   ? Hypertriglyceridemia   ? ? ? ?Allergies  ?Allergen Reactions  ? Sulfonamide Derivatives Other (See Comments)  ?  REACTION: disoriented  ? ? ? ?Current Outpatient Medications  ?Medication Sig Dispense Refill  ? aspirin EC 81 MG tablet Take 81 mg by mouth at bedtime.    ? atorvastatin (LIPITOR) 80 MG tablet Take 1 tablet (80 mg total) by mouth daily at 6 PM. 30 tablet 2  ? buPROPion (WELLBUTRIN XL) 150 MG 24 hr tablet Take 1 tablet (150 mg total) by mouth daily. (Patient taking differently: Take 150 mg by mouth at bedtime.) 30 tablet 0  ? lisinopril (ZESTRIL) 20 MG tablet Take 1 tablet (20 mg total) by mouth daily. 90 tablet 3  ? metoprolol succinate (TOPROL-XL) 25 MG  24 hr tablet Take 0.5 tablets (12.5 mg total) by mouth every morning. 45 tablet 3  ? metoprolol tartrate (LOPRESSOR) 25 MG tablet TAKE 0.5 TABLETS (12.5 MG TOTAL) BY MOUTH 2 (TWO) TIMES DAILY. 30 tablet 0  ? nitroGLYCERIN (NITROSTAT) 0.4 MG SL tablet Place 1 tablet (0.4 mg total) under the tongue every 5 (five) minutes x 3 doses as needed for chest pain. 25 tablet 3  ? omeprazole (PRILOSEC) 20 MG capsule Take 40 mg by mouth at bedtime.    ? sildenafil (VIAGRA) 100 MG tablet Take 1 tablet by mouth daily as needed.    ? ?No current facility-administered medications for this visit.  ? ? ? ?Past Surgical History:  ?Procedure Laterality Date  ? CARDIAC CATHETERIZATION    ? CORONARY STENT INTERVENTION N/A 06/26/2019  ? Procedure: CORONARY STENT INTERVENTION;  Surgeon: Yates Decamp, MD;  Location: [facility];  Service: Cardiovascular;  Laterality: N/A;  ? LEFT HEART CATH AND CORONARY ANGIOGRAPHY N/A 06/26/2019  ? Procedure: LEFT HEART CATH AND CORONARY ANGIOGRAPHY;  Surgeon: Yates Decamp, MD;  Location: [facility];  Service: Cardiovascular;  Laterality: N/A;  ? ? ? ?Allergies  ?Allergen Reactions  ? Sulfonamide Derivatives Other (See Comments)  ?  REACTION: disoriented  ? ? ? ? ?Family History  ?  Problem Relation Age of Onset  ? Heart attack Father 10  ? ? ? ?Social History ?[name] reports that he has never smoked. He has never used smokeless tobacco. ?[name] reports no history of alcohol use. ? ? ?Review of Systems ?CONSTITUTIONAL: No weight loss, fever, chills, weakness or fatigue.  ?HEENT: Eyes: No visual loss, blurred vision, double vision or yellow sclerae.No hearing loss, sneezing, congestion, runny nose or sore throat.  ?SKIN: No rash or itching.  ?CARDIOVASCULAR: per hpi ?RESPIRATORY: No shortness of breath, cough or sputum.  ?GASTROINTESTINAL: No anorexia, nausea, vomiting or diarrhea. No abdominal pain or blood.  ?GENITOURINARY: No burning on urination, no polyuria ?NEUROLOGICAL: No headache,  dizziness, syncope, paralysis, ataxia, numbness or tingling in the extremities. No change in bowel or bladder control.  ?MUSCULOSKELETAL: No muscle, back pain, joint pain or stiffness.  ?LYMPHATICS: No enlarged nodes. No history of splenectomy.  ?PSYCHIATRIC: No history of depression or anxiety.  ?ENDOCRINOLOGIC: No reports of sweating, cold or heat intolerance. No polyuria or polydipsia.  ?. ? ? ?Physical Examination ?Today's Vitals  ? 01/14/22 1022  ?BP: 104/70  ?Pulse: 68  ?SpO2: 99%  ?Weight: 201 lb 3.2 oz (91.3 kg)  ?Height: 6\' 3"  (1.905 m)  ? ?Body mass index is 25.15 kg/m?. ? ?Gen: resting comfortably, no acute distress ?HEENT: no scleral icterus, pupils equal round and reactive, no palptable cervical adenopathy,  ?CV: RRR, no m/r/g no jvd ?Resp: Clear to auscultation bilaterally ?GI: abdomen is soft, non-tender, non-distended, normal bowel sounds, no hepatosplenomegaly ?MSK: extremities are warm, no edema.  ?Skin: warm, no rash ?Neuro:  no focal deficits ?Psych: appropriate affect ? ? ?Diagnostic Studies ? ? ? ? ?Assessment and Plan  ?CAD ?- no recent cardiac symptoms, continue curren t meds ?  ?2. Hyperlipidemia ? -LDL at goal, continue current meds ?  ?3. HTN ?Bp is at goal, continue current meds ? ?F/u 1 year ? ? ? ? ? ? , M.D. ?

## 2022-03-18 ENCOUNTER — Telehealth: Payer: Self-pay | Admitting: Cardiology

## 2022-03-18 DIAGNOSIS — R079 Chest pain, unspecified: Secondary | ICD-10-CM

## 2022-03-18 DIAGNOSIS — R0602 Shortness of breath: Secondary | ICD-10-CM

## 2022-03-18 NOTE — Telephone Encounter (Signed)
Received referral from pt's PCP stating pt has been having chest pains relieved with nitroglycerin.   They're requesting pt to be seen in office, there's currently nothing open soon.   Please advise

## 2022-03-18 NOTE — Telephone Encounter (Signed)
Left message to return call - last seen in office 01/14/2022.

## 2022-03-22 NOTE — Telephone Encounter (Signed)
States that he had just been busy & not had a chance to call back yet.  States that he has had chest pain off / on since last visit in April about 8-10 times.  Rating about a 5/10 that does last about 10-15 minutes or so.  Has only taken his NTG x 2 with relief during this time frame.  Does notice some SOB during the episode.  Could be drinking too much caffeine & states he is drinking water now.  Could also be having issue with GERD.  Suggested that he go to ED for evaluation in the meantime if symptoms worsen as office will be closed tomorrow due to holiday.  He verbalized understanding.

## 2022-03-25 NOTE — Telephone Encounter (Signed)
I would go ahead and get an exericise nuclear stress test for him, hold toprol day of. If cannot exercise then lexiscan  Dominga Ferry MD

## 2022-03-26 NOTE — Telephone Encounter (Signed)
Left message to return call 

## 2022-03-29 ENCOUNTER — Encounter: Payer: Self-pay | Admitting: *Deleted

## 2022-03-29 NOTE — Telephone Encounter (Signed)
Patient notified and verbalized understanding.  He agrees to doing the stress test.  Instructions given.   Order entered & sent to [facility] for scheduling.

## 2022-03-31 ENCOUNTER — Ambulatory Visit (HOSPITAL_COMMUNITY)
Admission: RE | Admit: 2022-03-31 | Discharge: 2022-03-31 | Disposition: A | Discharge status: Home / Self Care | Payer: BC Managed Care – PPO | Source: Ambulatory Visit | Attending: Cardiology | Admitting: Cardiology

## 2022-03-31 DIAGNOSIS — R079 Chest pain, unspecified: Secondary | ICD-10-CM | POA: Diagnosis present

## 2022-03-31 DIAGNOSIS — R0602 Shortness of breath: Secondary | ICD-10-CM | POA: Diagnosis not present

## 2022-03-31 LAB — NM MYOCAR MULTI W/SPECT W/WALL MOTION / EF
Angina Index: 0
Duke Treadmill Score: 9
Estimated workload: 10.1
Exercise duration (min): 9 min
Exercise duration (sec): 17 s
LV dias vol: 109 mL (ref 62–150)
LV sys vol: 43 mL
MPHR: 166 {beats}/min
Nuc Stress EF: 61 %
Peak HR: 142 {beats}/min
Percent HR: 85 %
RATE: 0.2
RPE: 13
Rest HR: 56 {beats}/min
Rest Nuclear Isotope Dose: 10.5 mCi
SDS: 3
SRS: 1
SSS: 4
ST Depression (mm): 0 mm
Stress Nuclear Isotope Dose: 30 mCi
TID: 0.9

## 2022-03-31 MED ORDER — TECHNETIUM TC 99M TETROFOSMIN IV KIT
30.0000 | PACK | Freq: Once | INTRAVENOUS | Status: AC | PRN
  Administered 2022-03-31: 30 via INTRAVENOUS

## 2022-03-31 MED ORDER — SODIUM CHLORIDE FLUSH 0.9 % IV SOLN
INTRAVENOUS | Status: AC
  Administered 2022-03-31: 10 mL via INTRAVENOUS
  Filled 2022-03-31: qty 10

## 2022-03-31 MED ORDER — REGADENOSON 0.4 MG/5ML IV SOLN
INTRAVENOUS | Status: AC
  Filled 2022-03-31: qty 5

## 2022-03-31 MED ORDER — TECHNETIUM TC 99M TETROFOSMIN IV KIT
10.0000 | PACK | Freq: Once | INTRAVENOUS | Status: AC | PRN
  Administered 2022-03-31: 10.5 via INTRAVENOUS

## 2022-04-25 ENCOUNTER — Emergency Department (HOSPITAL_BASED_OUTPATIENT_CLINIC_OR_DEPARTMENT_OTHER)
Admission: EM | Admit: 2022-04-25 | Discharge: 2022-04-25 | Disposition: A | Discharge status: Home / Self Care | Payer: BC Managed Care – PPO | Attending: Emergency Medicine | Admitting: Emergency Medicine

## 2022-04-25 ENCOUNTER — Other Ambulatory Visit: Payer: Self-pay

## 2022-04-25 ENCOUNTER — Encounter (HOSPITAL_BASED_OUTPATIENT_CLINIC_OR_DEPARTMENT_OTHER): Payer: Self-pay | Admitting: Emergency Medicine

## 2022-04-25 ENCOUNTER — Emergency Department (HOSPITAL_BASED_OUTPATIENT_CLINIC_OR_DEPARTMENT_OTHER): Payer: BC Managed Care – PPO

## 2022-04-25 DIAGNOSIS — Z79899 Other long term (current) drug therapy: Secondary | ICD-10-CM | POA: Diagnosis not present

## 2022-04-25 DIAGNOSIS — I10 Essential (primary) hypertension: Secondary | ICD-10-CM | POA: Diagnosis not present

## 2022-04-25 DIAGNOSIS — K5790 Diverticulosis of intestine, part unspecified, without perforation or abscess without bleeding: Secondary | ICD-10-CM | POA: Diagnosis not present

## 2022-04-25 DIAGNOSIS — Z7982 Long term (current) use of aspirin: Secondary | ICD-10-CM | POA: Insufficient documentation

## 2022-04-25 DIAGNOSIS — I2510 Atherosclerotic heart disease of native coronary artery without angina pectoris: Secondary | ICD-10-CM | POA: Diagnosis not present

## 2022-04-25 DIAGNOSIS — R1012 Left upper quadrant pain: Secondary | ICD-10-CM | POA: Diagnosis present

## 2022-04-25 LAB — URINALYSIS, ROUTINE W REFLEX MICROSCOPIC
Bilirubin Urine: NEGATIVE
Glucose, UA: NEGATIVE mg/dL
Ketones, ur: NEGATIVE mg/dL
Leukocytes,Ua: NEGATIVE
Nitrite: NEGATIVE
Protein, ur: NEGATIVE mg/dL
Specific Gravity, Urine: 1.014 (ref 1.005–1.030)
pH: 5 (ref 5.0–8.0)

## 2022-04-25 LAB — COMPREHENSIVE METABOLIC PANEL
ALT: 22 U/L (ref 0–44)
AST: 20 U/L (ref 15–41)
Albumin: 4.7 g/dL (ref 3.5–5.0)
Alkaline Phosphatase: 68 U/L (ref 38–126)
Anion gap: 11 (ref 5–15)
BUN: 19 mg/dL (ref 6–20)
CO2: 24 mmol/L (ref 22–32)
Calcium: 10 mg/dL (ref 8.9–10.3)
Chloride: 103 mmol/L (ref 98–111)
Creatinine, Ser: 1.18 mg/dL (ref 0.61–1.24)
GFR, Estimated: >60 mL/min (ref >60)
Glucose, Bld: 84 mg/dL (ref 70–99)
Potassium: 4.8 mmol/L (ref 3.5–5.1)
Sodium: 138 mmol/L (ref 135–145)
Total Bilirubin: 0.7 mg/dL (ref 0.3–1.2)
Total Protein: 7.9 g/dL (ref 6.5–8.1)

## 2022-04-25 LAB — CBC WITH DIFFERENTIAL/PLATELET
Abs Immature Granulocytes: 0.02 10*3/uL (ref 0.00–0.07)
Basophils Absolute: 0 10*3/uL (ref 0.0–0.1)
Basophils Relative: 1 %
Eosinophils Absolute: 0.1 10*3/uL (ref 0.0–0.5)
Eosinophils Relative: 1 %
HCT: 45 % (ref 39.0–52.0)
Hemoglobin: 15.2 g/dL (ref 13.0–17.0)
Immature Granulocytes: 0 %
Lymphocytes Relative: 20 %
Lymphs Abs: 1.4 10*3/uL (ref 0.7–4.0)
MCH: 29.7 pg (ref 26.0–34.0)
MCHC: 33.8 g/dL (ref 30.0–36.0)
MCV: 87.9 fL (ref 80.0–100.0)
Monocytes Absolute: 0.7 10*3/uL (ref 0.1–1.0)
Monocytes Relative: 10 %
Neutro Abs: 4.7 10*3/uL (ref 1.7–7.7)
Neutrophils Relative %: 68 %
Platelets: 211 10*3/uL (ref 150–400)
RBC: 5.12 MIL/uL (ref 4.22–5.81)
RDW: 12 % (ref 11.5–15.5)
WBC: 6.9 10*3/uL (ref 4.0–10.5)
nRBC: 0 % (ref 0.0–0.2)

## 2022-04-25 LAB — LACTIC ACID, PLASMA: Lactic Acid, Venous: 1.4 mmol/L (ref 0.5–1.9)

## 2022-04-25 LAB — PROTIME-INR
INR: 1 (ref 0.8–1.2)
Prothrombin Time: 13.4 seconds (ref 11.4–15.2)

## 2022-04-25 LAB — LIPASE, BLOOD: Lipase: 41 U/L (ref 11–51)

## 2022-04-25 MED ORDER — IOHEXOL 350 MG/ML SOLN
100.0000 mL | Freq: Once | INTRAVENOUS | Status: AC | PRN
  Administered 2022-04-25: 100 mL via INTRAVENOUS

## 2022-04-25 MED ORDER — LACTATED RINGERS IV BOLUS
1000.0000 mL | Freq: Once | INTRAVENOUS | Status: AC
  Administered 2022-04-25: 1000 mL via INTRAVENOUS

## 2022-04-25 NOTE — ED Provider Notes (Signed)
[facility] EMERGENCY DEPT Provider Note   CSN: [id number] Arrival date & time: 04/25/22  1134     History  Chief Complaint  Patient presents with   Abdominal Pain    Jon Shaw is a 56 y.o. male with past medical history of CAD, hyperlipidemia, and hypertension presenting today for abdominal pain.  Reports that belly pain started last night and is localized on the left side of his abdomen.  The pain is dull and achy but nonradiating.  He did characterize the pain as severe stating that he was up all night and could not sleep.  Reports that this morning had a bowel movement with a small amount of bright red blood.  Denies fever diarrhea and vomiting.  Does endorse nausea and fatigue.  Denies chest pain and shortness of breath.   Abdominal Pain      Home Medications Prior to Admission medications   Medication Sig Start Date End Date Taking? Authorizing Provider  aspirin EC 81 MG tablet Take 81 mg by mouth at bedtime.    [provider]  atorvastatin (LIPITOR) 80 MG tablet Take 1 tablet (80 mg total) by mouth every evening. 01/14/22   Antoine Poche, MD  buPROPion (WELLBUTRIN XL) 150 MG 24 hr tablet Take 1 tablet (150 mg total) by mouth daily. Patient taking differently: Take 150 mg by mouth at bedtime. 05/18/16   Adonis Brook, NP  lisinopril (ZESTRIL) 20 MG tablet Take 1 tablet (20 mg total) by mouth daily. 08/15/19 01/14/49  Toniann Fail, NP  metoprolol succinate (TOPROL-XL) 25 MG 24 hr tablet Take 0.5 tablets (12.5 mg total) by mouth every morning. 01/14/22   Antoine Poche, MD  nitroGLYCERIN (NITROSTAT) 0.4 MG SL tablet Place 1 tablet (0.4 mg total) under the tongue every 5 (five) minutes x 3 doses as needed for chest pain. 06/27/19   Yates Decamp, MD  omeprazole (PRILOSEC) 40 MG capsule Take 40 mg by mouth daily. 12/09/21   [provider]  sildenafil (VIAGRA) 100 MG tablet Take 1 tablet by mouth daily as needed. 07/11/19 01/14/49   [provider]      Allergies    Sulfonamide derivatives    Review of Systems   Review of Systems  Gastrointestinal:  Positive for abdominal pain.    Physical Exam Updated Vital Signs BP (!) 124/93   Pulse 62   Temp 98 F (36.7 C)   Resp 18   SpO2 99%  Physical Exam Vitals and nursing note reviewed.  HENT:     Head: Normocephalic and atraumatic.     Mouth/Throat:     Mouth: Mucous membranes are moist.  Eyes:     General:        Right eye: No discharge.        Left eye: No discharge.     Conjunctiva/sclera: Conjunctivae normal.  Cardiovascular:     Rate and Rhythm: Normal rate and regular rhythm.     Pulses: Normal pulses.     Heart sounds: Normal heart sounds.  Pulmonary:     Effort: Pulmonary effort is normal.     Breath sounds: Normal breath sounds.  Abdominal:     General: Abdomen is flat.     Palpations: Abdomen is soft.     Tenderness: There is abdominal tenderness in the left upper quadrant.  Skin:    General: Skin is warm and dry.  Neurological:     General: No focal deficit present.  Psychiatric:  Mood and Affect: Mood normal.     ED Results / Procedures / Treatments   Labs (all labs ordered are listed, but only abnormal results are displayed) Labs Reviewed  URINALYSIS, ROUTINE W REFLEX MICROSCOPIC - Abnormal; Notable for the following components:      Result Value   Hgb urine dipstick SMALL (*)    All other components within normal limits  COMPREHENSIVE METABOLIC PANEL  CBC WITH DIFFERENTIAL/PLATELET  PROTIME-INR  LIPASE, BLOOD  LACTIC ACID, PLASMA  URINALYSIS, COMPLETE (UACMP) WITH MICROSCOPIC    EKG None  Radiology CT Angio Abd/Pel w/ and/or w/o  Result Date: 04/25/2022 CLINICAL DATA:  Left upper quadrant pain, evaluate for mesenteric ischemia EXAM: CTA ABDOMEN AND PELVIS WITHOUT AND WITH CONTRAST TECHNIQUE: Multidetector CT imaging of the abdomen and pelvis was performed using the standard protocol during bolus  administration of intravenous contrast. Multiplanar reconstructed images and MIPs were obtained and reviewed to evaluate the vascular anatomy. RADIATION DOSE REDUCTION: This exam was performed according to the departmental dose-optimization program which includes automated exposure control, adjustment of the mA and/or kV according to patient size and/or use of iterative reconstruction technique. CONTRAST:  OMNIPAQUE IOHEXOL 350 MG/ML SOLN COMPARISON:  None Available. FINDINGS: VASCULAR Normal contour and caliber of the abdominal aorta. No evidence of aneurysm, dissection, or other acute aortic pathology. Standard branching pattern of the abdominal aorta, with solitary bilateral renal arteries. The branch vessels are widely patent, with specific attention to the superior mesenteric artery and its proximal branches. Minimal mixed calcific atherosclerosis. Review of the MIP images confirms the above findings. NON-VASCULAR Lower chest: No acute abnormality. Hepatobiliary: No solid liver abnormality is seen. No gallstones, gallbladder wall thickening, or biliary dilatation. Pancreas: Unremarkable. No pancreatic ductal dilatation or surrounding inflammatory changes. Spleen: Normal in size without significant abnormality. Adrenals/Urinary Tract: Adrenal glands are unremarkable. Kidneys are normal, without renal calculi, solid lesion, or hydronephrosis. Bladder is unremarkable. Stomach/Bowel: Stomach is within normal limits. Appendix appears normal. No evidence of bowel wall thickening, distention, or inflammatory changes. Lymphatic: No significant vascular findings are present. No enlarged abdominal or pelvic lymph nodes. Reproductive: No mass or other significant abnormality. Other: No abdominal wall hernia or abnormality. No ascites. Musculoskeletal: No acute or significant osseous findings. IMPRESSION: 1. Normal contour and caliber of the abdominal aorta. No evidence of aneurysm, dissection, or other acute aortic  pathology. Minimal mixed calcific atherosclerosis. 2. The branch vessels are widely patent, with specific attention to the superior mesenteric artery and its proximal branches per report of concern for mesenteric ischemia. 3. No CT findings of the abdomen or pelvis to explain left upper quadrant pain. 4. Diverticulosis without evidence of acute diverticulitis. Electronically Signed   By: Jearld Lesch M.D.   On: 04/25/2022 16:56    Procedures Procedures    Medications Ordered in ED Medications  lactated ringers bolus 1,000 mL (1,000 mLs Intravenous New Bag/Given 04/25/22 1501)  iohexol (OMNIPAQUE) 350 MG/ML injection 100 mL (100 mLs Intravenous Contrast Given 04/25/22 1615)    ED Course/ Medical Decision Making/ A&P                           Medical Decision Making Amount and/or Complexity of Data Reviewed Labs: ordered.   This patient presents to the ED for concern of abdominal pain, this involves a number of treatment options, and is a complaint that carries with it a high risk of complications and morbidity.  The differential diagnosis includes mesenteric  ischemia, aortic dissection, ACS and diverticulitis.    Co morbidities: Discussed in HPI   EMR reviewed including pt PMHx, past surgical history and past visits to ER.   See HPI for more details   Lab Tests:   I personally reviewed all laboratory work and imaging. Metabolic panel without any acute abnormality specifically kidney function within normal limits and no significant electrolyte abnormalities. CBC without leukocytosis or significant anemia.   Imaging Studies:  Abnormal findings. I personally reviewed all imaging studies. Imaging notable for diverticulosis without diverticulitis.    Cardiac Monitoring:  The patient was maintained on a cardiac monitor.  I personally viewed and interpreted the cardiac monitored which showed an underlying rhythm of: NSR EKG non-ischemic   Medicines ordered:  I ordered  medication including 1 L LR bolus for volume resuscitation Reevaluation of the patient after these medicines showed that the patient improved I have reviewed the patients home medicines and have made adjustments as needed    Reevaluation:  After the interventions noted above I re-evaluated patient and found that they have : Serial abdominal exams had improved    Problem List / ED Course:  Patient presented for abdominal pain.  Pain was left upper and lower quadrant.  Given history of CAD and severe pain the night before, there was initial concern for mesenteric ischemia.  CT angio of the abdomen pelvis were negative but did reveal diverticulosis without acute diverticulitis.  Vitals were normal without fever and no leukocytosis.  With this in mind, did not feel it was pertinent to treat with antibiotics.  Recommended close follow-up with PCP.  Also discussed criteria for return to the emergency department.   Dispostion:  After consideration of the diagnostic results and the patients response to treatment, I feel that the patent would benefit from close follow-up with PCP but otherwise is appropriate for discharge home.         Final Clinical Impression(s) / ED Diagnoses Final diagnoses:  Diverticulosis    Rx / DC Orders ED Discharge Orders     None         Gareth Eagle, PA-C 04/25/22 1729    Alvira Monday, MD 04/25/22 [phone]

## 2022-04-25 NOTE — ED Triage Notes (Signed)
Severe abdominal (gas pain ) pain last night.took gas x with no relief,had loose bm. This am had bm, was noted to have bright red blood .

## 2022-04-25 NOTE — Discharge Instructions (Signed)
Diagnosed with diverticulosis. Given that you did not have a fever, normal white blood cell count, and your vitals are normal, antibiotics are not indicated at this time.  Strongly recommend close follow-up with PCP in the next 2 to 3 days.  If abdominal pain worsens, new fever, worsening bloody stools, please return to the emergency department.

## 2022-05-14 IMAGING — CT CT HEAD W/O CM
4 series · 17 of 47 positions shown, 19 images · non-contrast
Comparison: None.

CLINICAL DATA: Headache

EXAM:
CT HEAD WITHOUT CONTRAST
TECHNIQUE: Contiguous axial images were obtained from the base of the skull
through the vertex without intravenous contrast.

[Series 3: head wo · axial · 0.49mm/px · z∈[+1298,+1423]mm · 7 of 35 slices shown, 9 images]
[im 5/35  brain]
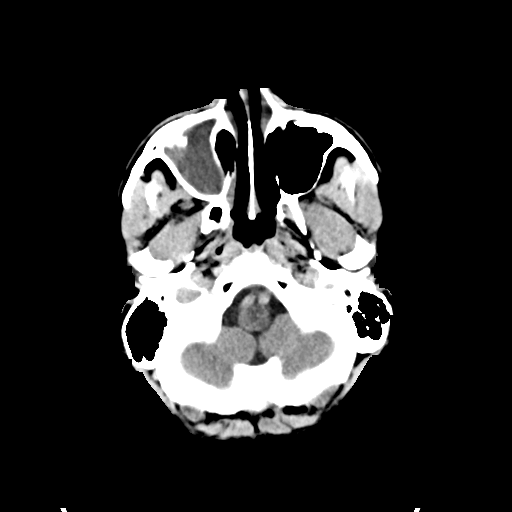
[im 5/35  bone]
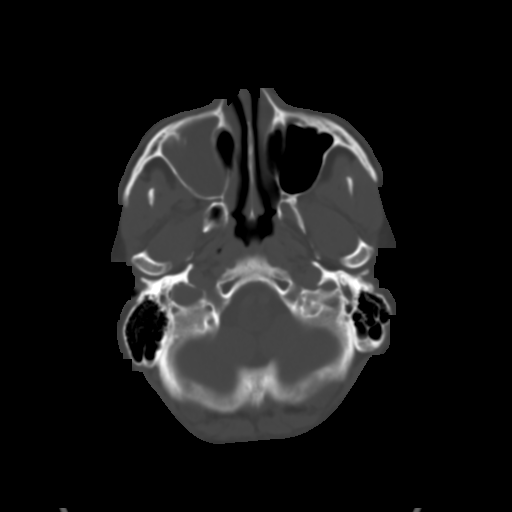
[im 9/35  brain]
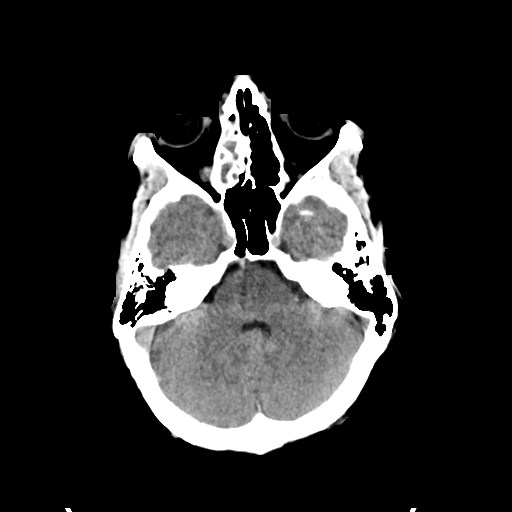
[im 13/35  brain]
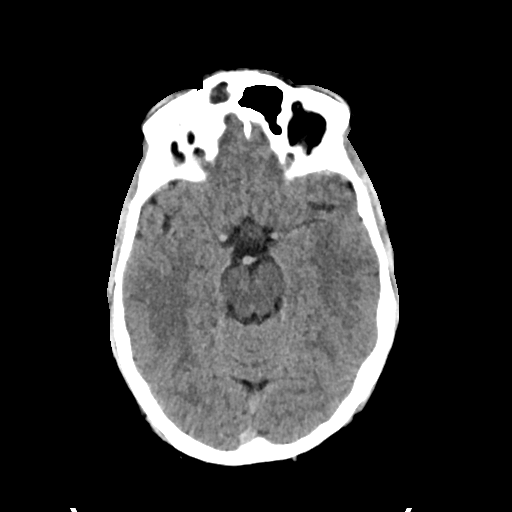
[im 18/35  brain]
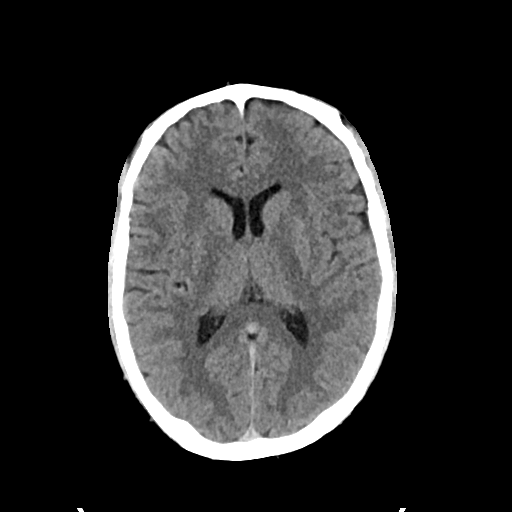
[im 22/35  brain]
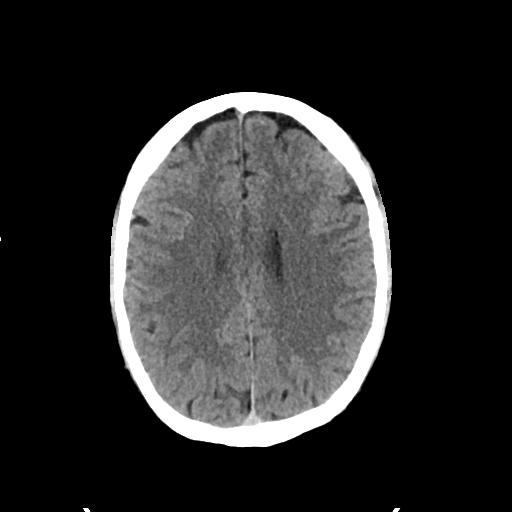
[im 22/35  bone]
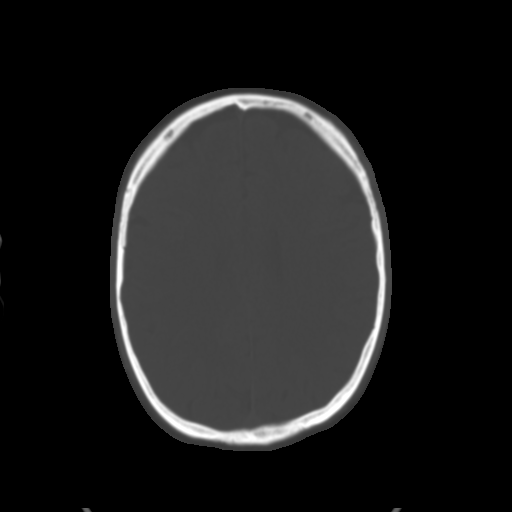
[im 26/35  brain]
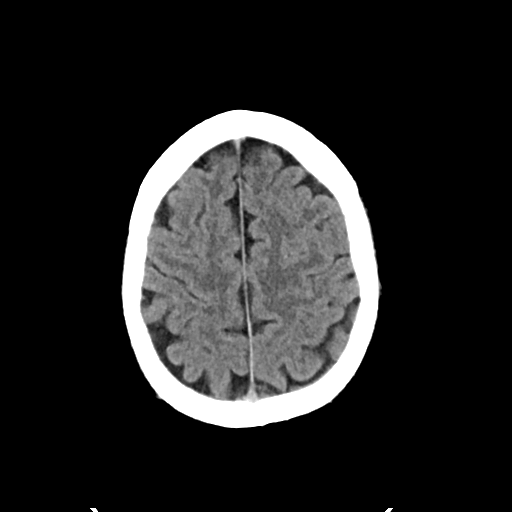
[im 30/35  brain]
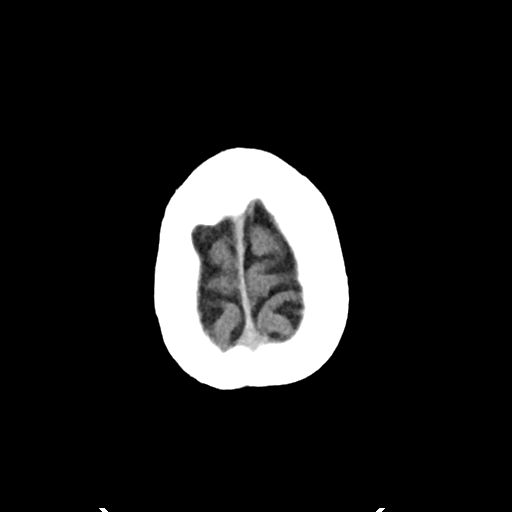

[Series 4: head bone · axial · 0.49mm/px · z∈[+1294,+1354]mm · 4 of 86 slices shown]
[im 9/86  bone]
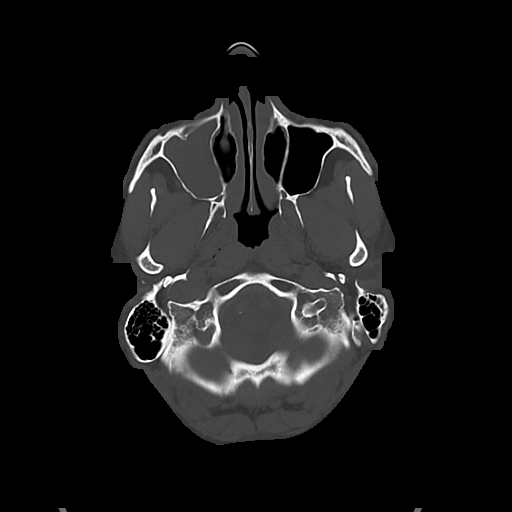
[im 18/86  bone]
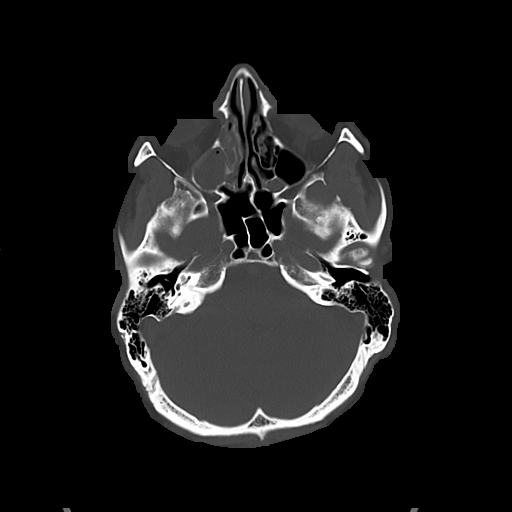
[im 26/86  bone]
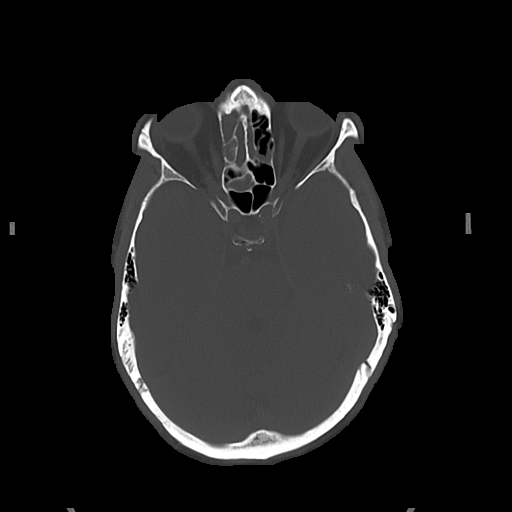
[im 39/86  bone]
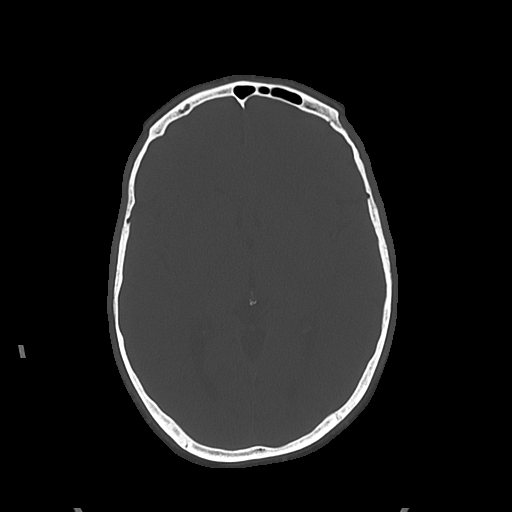

[Series 5: cor soft · coronal · 0.35mm/px · 3 of 75 slices shown]
[im 25/75  brain]
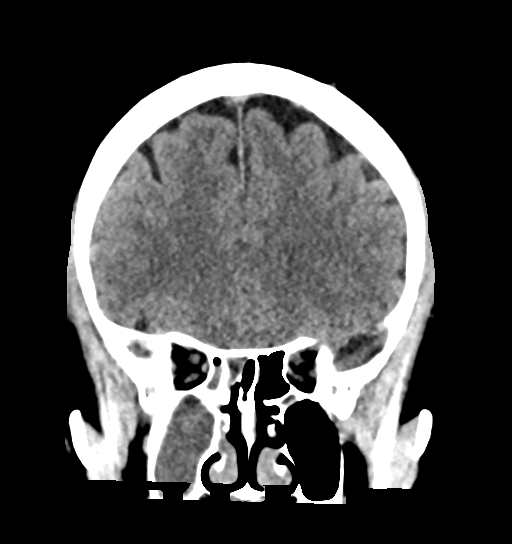
[im 33/75  brain]
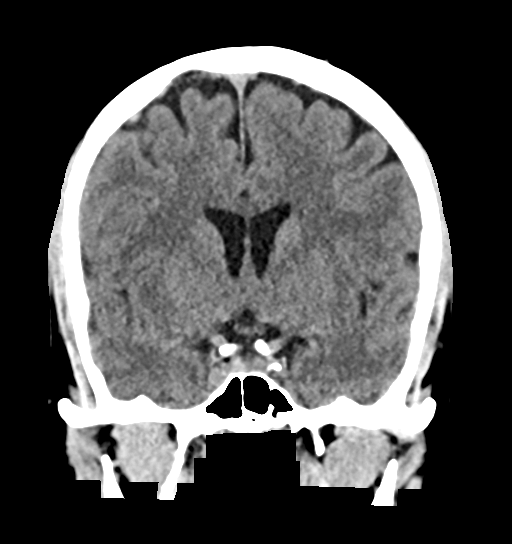
[im 42/75  brain]
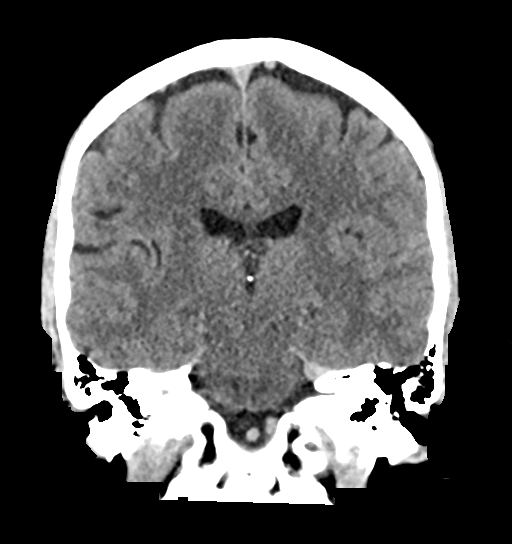

[Series 6: sag soft · sagittal · 0.36mm/px · 3 of 60 slices shown]
[im 20/60  brain]
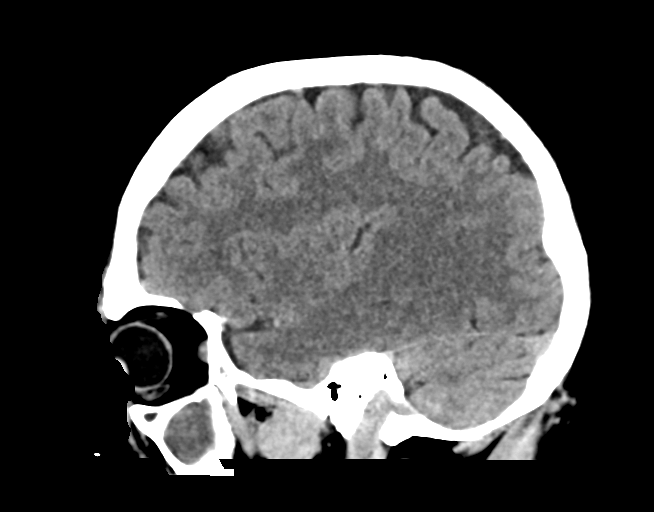
[im 30/60  brain]
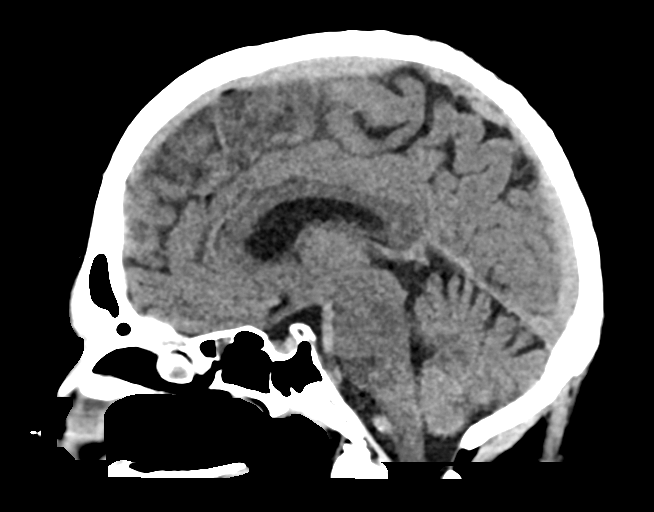
[im 40/60  brain]
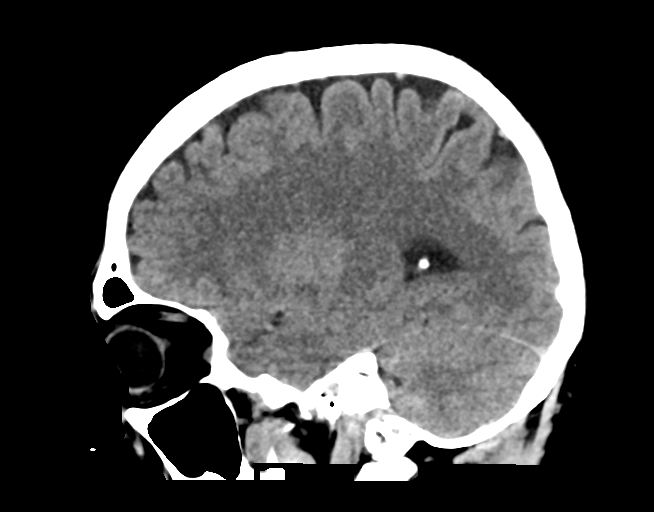

[17 of 47 positions shown; findings below may reference images not displayed]

FINDINGS: Brain: No acute intracranial abnormality. Specifically, no
hemorrhage, hydrocephalus, mass lesion, acute infarction, or
significant intracranial injury.

Vascular: No hyperdense vessel or unexpected calcification.

Skull: No acute calvarial abnormality.

Sinuses/Orbits: Opacified right mastoid and frontal sinuses and
right ethmoid air cells

Other: None
IMPRESSION: No intracranial abnormality.

Right chronic sinusitis.

## 2023-02-07 ENCOUNTER — Other Ambulatory Visit: Payer: Self-pay | Admitting: Cardiology

## 2023-03-16 ENCOUNTER — Other Ambulatory Visit: Payer: Self-pay | Admitting: Cardiology

## 2023-04-11 ENCOUNTER — Ambulatory Visit: Payer: BC Managed Care – PPO | Admitting: Family Medicine

## 2023-06-20 ENCOUNTER — Telehealth: Payer: Self-pay | Admitting: Cardiology

## 2023-06-20 MED ORDER — METOPROLOL SUCCINATE ER 25 MG PO TB24: 12.5000 mg | ORAL_TABLET | Freq: Every day | ORAL | 0 refills | Status: DC

## 2023-06-20 NOTE — Telephone Encounter (Signed)
Medication filled until next appointment

## 2023-06-20 NOTE — Telephone Encounter (Signed)
*  STAT* If patient is at the pharmacy, call can be transferred to refill team.   1. Which medications need to be refilled? (please list name of each medication and dose if known)   metoprolol succinate (TOPROL-XL) 25 MG 24 hr tablet    2. Which pharmacy/location (including street and city if local pharmacy) is medication to be sent to?  [id number] - [place], [place] - [address]      3. Do they need a 30 day or 90 day supply? 30 day supply   Pt is out of medication and is scheduled for office visit on 06/28/2023.

## 2023-06-28 ENCOUNTER — Ambulatory Visit: Payer: BC Managed Care – PPO | Attending: Cardiology | Admitting: Cardiology

## 2023-06-28 ENCOUNTER — Encounter: Payer: Self-pay | Admitting: Cardiology

## 2023-06-28 VITALS — BP 116/74 | HR 61 | Ht 75.0 in | Wt 200.8 lb

## 2023-06-28 DIAGNOSIS — I2510 Atherosclerotic heart disease of native coronary artery without angina pectoris: Secondary | ICD-10-CM | POA: Diagnosis not present

## 2023-06-28 DIAGNOSIS — E782 Mixed hyperlipidemia: Secondary | ICD-10-CM | POA: Diagnosis not present

## 2023-06-28 DIAGNOSIS — I10 Essential (primary) hypertension: Secondary | ICD-10-CM | POA: Diagnosis not present

## 2023-06-28 NOTE — Patient Instructions (Addendum)

## 2023-06-28 NOTE — Progress Notes (Signed)
Clinical Summary Mr. Moyano is a 57 y.o.male seen today for follow up of the following medical problem.s   1.CAD - DES to mid LAD in 06/2019, residual 40% D1 and distal LAD diease - 02/2020 nuclear stress: no clear ischemia, likely artifact inferolateral cannot completely exclude very mild ischemia. Low risk - 06/2019 echo LVEF 55-60%, mild MR       - fatigue/low bp's on lopressor 12.5mg  bid, changed to toprol 12.5mg  daily.    - for about 1 month after eating, tightness left chest. Would last about 1 hour, some indigestion.  - no exertional symptoms.  - compliant with meds  03/2022 nuclear stress: no ischemia. Exercised 9 minutes, [facility] treadmill score 9 - no recent chest pains.  - compliant with meds   2. HTN - he is compliant with meds     3. Hyperlipidemia 02/2021 TC 103 TG 156 HDL 27 LDL 49 - 02/2022 TC 914 TG 782 HDL 30 LDL 50 - compliant with atorvastatin.   4. Day time somnolence - sleeps about 5-6 hours. Troubles staying asleep - takes some naps during day time.        SH:  Semi retired, works as Education officer, environmental.   Upcoming cruise to [place] with his wife, 2 separate cruises. .Number  22 and 23 cruises for him Past Medical History:  Diagnosis Date   Coronary artery disease    a. s/p cath in 06/2019 with DES to mid-LAD and residual 40% D1 stenosis and 40% distal LAD stenosis with medical management recommended   Depression    GERD (gastroesophageal reflux disease)    History of cardiac catheterization    EF 60% 6/11   Hypertension    Hypertriglyceridemia      Allergies  Allergen Reactions   Sulfonamide Derivatives Other (See Comments)    REACTION: disoriented     Current Outpatient Medications  Medication Sig Dispense Refill   aspirin EC 81 MG tablet Take 81 mg by mouth at bedtime.     atorvastatin (LIPITOR) 80 MG tablet TAKE 1 TABLET EVERY EVENING 90 tablet 3   buPROPion (WELLBUTRIN XL) 150 MG 24 hr tablet Take 1 tablet (150 mg total) by mouth  daily. (Patient taking differently: Take 150 mg by mouth at bedtime.) 30 tablet 0   lisinopril (ZESTRIL) 20 MG tablet Take 1 tablet (20 mg total) by mouth daily. 90 tablet 3   metoprolol succinate (TOPROL-XL) 25 MG 24 hr tablet Take 0.5 tablets (12.5 mg total) by mouth daily. 15 tablet 0   nitroGLYCERIN (NITROSTAT) 0.4 MG SL tablet Place 1 tablet (0.4 mg total) under the tongue every 5 (five) minutes x 3 doses as needed for chest pain. 25 tablet 3   omeprazole (PRILOSEC) 40 MG capsule Take 40 mg by mouth daily.     sildenafil (VIAGRA) 100 MG tablet Take 1 tablet by mouth daily as needed.     No current facility-administered medications for this visit.     Past Surgical History:  Procedure Laterality Date   CARDIAC CATHETERIZATION     CORONARY STENT INTERVENTION N/A 06/26/2019   Procedure: CORONARY STENT INTERVENTION;  Surgeon: Yates Decamp, MD;  Location: [facility];  Service: Cardiovascular;  Laterality: N/A;   LEFT HEART CATH AND CORONARY ANGIOGRAPHY N/A 06/26/2019   Procedure: LEFT HEART CATH AND CORONARY ANGIOGRAPHY;  Surgeon: Yates Decamp, MD;  Location: [facility];  Service: Cardiovascular;  Laterality: N/A;     Allergies  Allergen Reactions  Sulfonamide Derivatives Other (See Comments)    REACTION: disoriented      Family History  Problem Relation Age of Onset   Heart attack Father 45     Social History Mr. Mabile reports that he has never smoked. He has never used smokeless tobacco. Mr. Amrhein reports no history of alcohol use.   Review of Systems CONSTITUTIONAL: No weight loss, fever, chills, weakness or fatigue.  HEENT: Eyes: No visual loss, blurred vision, double vision or yellow sclerae.No hearing loss, sneezing, congestion, runny nose or sore throat.  SKIN: No rash or itching.  CARDIOVASCULAR: per hpi RESPIRATORY: No shortness of breath, cough or sputum.  GASTROINTESTINAL: No anorexia, nausea, vomiting or diarrhea. No abdominal pain or blood.   GENITOURINARY: No burning on urination, no polyuria NEUROLOGICAL: No headache, dizziness, syncope, paralysis, ataxia, numbness or tingling in the extremities. No change in bowel or bladder control.  MUSCULOSKELETAL: No muscle, back pain, joint pain or stiffness.  LYMPHATICS: No enlarged nodes. No history of splenectomy.  PSYCHIATRIC: No history of depression or anxiety.  ENDOCRINOLOGIC: No reports of sweating, cold or heat intolerance. No polyuria or polydipsia.  Marland Kitchen   Physical Examination Today's Vitals   06/28/23 1539  BP: 116/74  Pulse: 61  SpO2: 98%  Weight: 200 lb 12.8 oz (91.1 kg)  Height: 6\' 3"  (1.905 m)   Body mass index is 25.1 kg/m.  Gen: resting comfortably, no acute distress HEENT: no scleral icterus, pupils equal round and reactive, no palptable cervical adenopathy,  CV: RRR, no mrg, no jvd Resp: Clear to auscultation bilaterally GI: abdomen is soft, non-tender, non-distended, normal bowel sounds, no hepatosplenomegaly MSK: extremities are warm, no edema.  Skin: warm, no rash Neuro:  no focal deficits Psych: appropriate affect   Diagnostic Studies  03/2022 nuclear stress LV perfusion is normal. There is no evidence of ischemia. There is no evidence of infarction.  Reduced counts in the inferior segments with normal wall motion likely consistent with diaphragm attenuation.  There are mildly reduced counts in the inferior lateral basal to mid segments which are fixed.  This likely represents artifact.  Normal study without evidence of ischemia or infarction.   Left ventricular function is normal. Nuclear stress EF: 61 %. The left ventricular ejection fraction is normal (55-65%). End diastolic cavity size is normal.   The study is normal. The study is low risk.   Assessment and Plan   CAD -no symptoms, stress test last year was normal - continue current meds - EKG shows NSR, no ischemic changes   2. Hyperlipidemia - LDL remains at goal, continue currentmeds    3. HTN Bp is at goal, continue current meds  F/u 1 year     Antoine Poche, M.D.

## 2023-07-12 ENCOUNTER — Other Ambulatory Visit: Payer: Self-pay | Admitting: Cardiology

## 2023-11-02 ENCOUNTER — Ambulatory Visit: Payer: BC Managed Care – PPO | Admitting: Cardiology

## 2023-11-17 ENCOUNTER — Ambulatory Visit: Payer: BC Managed Care – PPO | Admitting: Cardiology

## 2024-01-04 ENCOUNTER — Other Ambulatory Visit: Payer: Self-pay | Admitting: Cardiology

## 2024-03-12 ENCOUNTER — Other Ambulatory Visit: Payer: Self-pay | Admitting: Cardiology

## 2024-05-17 ENCOUNTER — Other Ambulatory Visit: Payer: Self-pay | Admitting: Cardiology

## 2024-10-03 ENCOUNTER — Other Ambulatory Visit: Payer: Self-pay | Admitting: Cardiology
